# Patient Record
Sex: Male | Born: 1940 | Race: White | Hispanic: No | Marital: Married | Smoking: Former smoker
Health system: Southern US, Community
[De-identification: ages and names within clinical notes are randomized; demographics above are authoritative.]

## PROBLEM LIST (undated history)

## (undated) DIAGNOSIS — E785 Hyperlipidemia, unspecified: Secondary | ICD-10-CM

## (undated) DIAGNOSIS — L309 Dermatitis, unspecified: Secondary | ICD-10-CM

## (undated) DIAGNOSIS — M545 Low back pain, unspecified: Secondary | ICD-10-CM

## (undated) DIAGNOSIS — Z860100 Personal history of colon polyps, unspecified: Secondary | ICD-10-CM

## (undated) DIAGNOSIS — L409 Psoriasis, unspecified: Secondary | ICD-10-CM

## (undated) DIAGNOSIS — M1990 Unspecified osteoarthritis, unspecified site: Secondary | ICD-10-CM

## (undated) DIAGNOSIS — N400 Enlarged prostate without lower urinary tract symptoms: Secondary | ICD-10-CM

## (undated) DIAGNOSIS — G4730 Sleep apnea, unspecified: Secondary | ICD-10-CM

## (undated) DIAGNOSIS — R351 Nocturia: Secondary | ICD-10-CM

## (undated) DIAGNOSIS — F329 Major depressive disorder, single episode, unspecified: Secondary | ICD-10-CM

## (undated) DIAGNOSIS — I10 Essential (primary) hypertension: Secondary | ICD-10-CM

## (undated) DIAGNOSIS — R251 Tremor, unspecified: Secondary | ICD-10-CM

## (undated) DIAGNOSIS — F32A Depression, unspecified: Secondary | ICD-10-CM

## (undated) DIAGNOSIS — Z87442 Personal history of urinary calculi: Secondary | ICD-10-CM

## (undated) DIAGNOSIS — G629 Polyneuropathy, unspecified: Secondary | ICD-10-CM

## (undated) DIAGNOSIS — E119 Type 2 diabetes mellitus without complications: Secondary | ICD-10-CM

## (undated) DIAGNOSIS — R3915 Urgency of urination: Secondary | ICD-10-CM

## (undated) HISTORY — PX: OTHER SURGICAL HISTORY: SHX169

## (undated) HISTORY — PX: EYE SURGERY: SHX253

## (undated) HISTORY — DX: Polyneuropathy, unspecified: G62.9

## (undated) HISTORY — PX: CIRCUMCISION: SUR203

## (undated) HISTORY — PX: ANKLE FUSION: SHX881

## (undated) HISTORY — DX: Tremor, unspecified: R25.1

## (undated) HISTORY — PX: COLONOSCOPY: SHX174

## (undated) HISTORY — PX: ANKLE ARTHROSCOPY: SUR85

## (undated) HISTORY — DX: Type 2 diabetes mellitus without complications: E11.9

## (undated) HISTORY — DX: Depression, unspecified: F32.A

## (undated) HISTORY — DX: Essential (primary) hypertension: I10

## (undated) HISTORY — DX: Major depressive disorder, single episode, unspecified: F32.9

---

## 2015-02-06 ENCOUNTER — Encounter: Payer: Self-pay | Admitting: Neurology

## 2015-02-06 ENCOUNTER — Ambulatory Visit (INDEPENDENT_AMBULATORY_CARE_PROVIDER_SITE_OTHER): Payer: Medicare Other | Admitting: Neurology

## 2015-02-06 VITALS — BP 120/80 | HR 70 | Ht 73.0 in | Wt 264.0 lb

## 2015-02-06 DIAGNOSIS — E1342 Other specified diabetes mellitus with diabetic polyneuropathy: Secondary | ICD-10-CM | POA: Diagnosis not present

## 2015-02-06 DIAGNOSIS — G250 Essential tremor: Secondary | ICD-10-CM | POA: Diagnosis not present

## 2015-02-06 DIAGNOSIS — E1142 Type 2 diabetes mellitus with diabetic polyneuropathy: Secondary | ICD-10-CM

## 2015-02-06 DIAGNOSIS — G629 Polyneuropathy, unspecified: Secondary | ICD-10-CM

## 2015-02-06 NOTE — Progress Notes (Signed)
Subjective:   Logan Griffith was seen in consultation in the movement disorder clinic at the request of LONG,RANDY, MD.  The evaluation is for tremor.  The patient is seen in consultation at the request of Dr. Jannifer Franklin.  I have reviewed her notes and appreciate those.  This patient is accompanied in the office by his spouse and child who supplement the history.  The patient has had essential tremor for approximately 5 years according to records.   It has been worse over the last 2 years.  It is in both hands.  He notices it most in the R hand but he is R hand dominant.  He has trouble writing checks and eating sounds.  The patient is on primidone, 250 mg daily.  He has never been on a higher dosage than this but he indicates that it is not helpful.  The patient states he is on labetalol, 300 mg twice a day.  He was told that he could take a higher dosage of this to see if that helped his tremor, but he has not done that.  Pt states that the labetolol is for BP, however.   He has noticed no benefit with the primidone.  Unfortunately, these medications are not helping.  He does have a history of kidney stones, and therefore is not able to have Topamax.  There is no family hx of tremor.  He comes in today looking for an opinion regarding DBS therapy.   Affected by caffeine:  No. (1-2 cups coffee/day - 18 oz each; 1-2 diet soda's a day) Affected by alcohol:  No. Affected by stress:  No. Affected by fatigue:  No. Spills soup if on spoon:  Yes.   Spills glass of liquid if full:  Yes.   Affects ADL's (tying shoes, brushing teeth, etc):  Yes.   (has to shave with 2 hands with blade)  Current/Previously tried tremor medications: primidone; beta blocker for BP  Outside reports reviewed: historical medical records and office notes.  No Known Allergies  Outpatient Encounter Prescriptions as of 02/06/2015  Medication Sig  . amLODipine (NORVASC) 10 MG tablet Take 10 mg by mouth daily.  Marland Kitchen aspirin 81 MG tablet  Take 81 mg by mouth daily.  Marland Kitchen atorvastatin (LIPITOR) 40 MG tablet Take 40 mg by mouth daily.  . beta carotene w/minerals (OCUVITE) tablet Take 1 tablet by mouth daily.  . Cholecalciferol (VITAMIN D3) 3000 UNITS TABS Take by mouth.  Marland Kitchen CINNAMON PO Take by mouth.  . empagliflozin (JARDIANCE) 25 MG TABS tablet Take 25 mg by mouth daily.  . Flaxseed, Linseed, (FLAXSEED OIL) 1000 MG CAPS Take by mouth.  . gabapentin (NEURONTIN) 600 MG tablet Take 600 mg by mouth 2 (two) times daily.  Marland Kitchen GARLIC PO Take by mouth.  Marland Kitchen gemfibrozil (LOPID) 600 MG tablet Take 600 mg by mouth 2 (two) times daily before a meal.  . glimepiride (AMARYL) 4 MG tablet Take 4 mg by mouth daily with breakfast.  . HYDROcodone-acetaminophen (VICODIN) 5-500 MG per tablet Take 1 tablet by mouth every 6 (six) hours as needed for pain.  Marland Kitchen labetalol (NORMODYNE) 300 MG tablet Take 300 mg by mouth 2 (two) times daily.  Marland Kitchen losartan-hydrochlorothiazide (HYZAAR) 100-25 MG per tablet Take 1 tablet by mouth daily.  . Methylsulfonylmethane (MSM) 1000 MG CAPS Take by mouth.  . Multiple Vitamin (MULTIVITAMIN) tablet Take 1 tablet by mouth daily.  Marland Kitchen oxybutynin (DITROPAN) 5 MG tablet Take 5 mg by mouth 3 (three) times daily.  Marland Kitchen  Potassium 99 MG TABS Take by mouth.  . primidone (MYSOLINE) 250 MG tablet Take 250 mg by mouth at bedtime.  . Probiotic Product (PROBIOTIC PO) Take by mouth.  . sertraline (ZOLOFT) 100 MG tablet Take 100 mg by mouth daily.  . tamsulosin (FLOMAX) 0.4 MG CAPS capsule Take 0.4 mg by mouth.  . vitamin B-12 (CYANOCOBALAMIN) 1000 MCG tablet Take 1,000 mcg by mouth daily.   No facility-administered encounter medications on file as of 02/06/2015.    Past Medical History  Diagnosis Date  . Diabetes   . Hypertension   . Peripheral neuropathy   . Tremor   . Depression   . Prostate disorder   . Back pain     Past Surgical History  Procedure Laterality Date  . Thumb fusion    . Ankle arthroscopy Left   . Circumcision       Social History   Social History  . Marital Status: Married    Spouse Name: N/A  . Number of Children: N/A  . Years of Education: N/A   Occupational History  . Not on file.   Social History Main Topics  . Smoking status: Former Smoker    Quit date: 06/06/1982  . Smokeless tobacco: Not on file  . Alcohol Use: 0.0 oz/week    0 Standard drinks or equivalent per week     Comment: once a week  . Drug Use: No  . Sexual Activity: Not on file   Other Topics Concern  . Not on file   Social History Narrative  . No narrative on file    Family Status  Relation Status Death Age  . Mother Deceased     HTN  . Father Deceased     HTN  . Brother Alive     Review of Systems He admits to poor balance, and his son mentions this as well.  A complete 10 system ROS was obtained and was negative apart from what is mentioned.   Objective:   VITALS:   Filed Vitals:   02/06/15 1413  BP: 120/80  Pulse: 70  Height: 6\' 1"  (1.854 m)  Weight: 264 lb (119.75 kg)   Gen:  Appears stated age and in NAD. HEENT:  Normocephalic, atraumatic. The mucous membranes are moist. The superficial temporal arteries are without ropiness or tenderness. Cardiovascular: Regular rate and rhythm. Lungs: Clear to auscultation bilaterally. Neck: There are no carotid bruits noted bilaterally.  NEUROLOGICAL:  Orientation:  The patient is alert and oriented x 3.  Recent and remote memory are intact.  Attention span and concentration are normal.  Able to name objects and repeat without trouble.  Fund of knowledge is appropriate Cranial nerves: There is good facial symmetry. The pupils are equal round and reactive to light bilaterally. Fundoscopic exam reveals clear disc margins bilaterally. Extraocular muscles are intact and visual fields are full to confrontational testing. Speech is fluent and clear. Soft palate rises symmetrically and there is no tongue deviation. Hearing is intact to conversational  tone. Tone: Tone is good throughout. Sensation: Sensation is intact to light touch and pinprick throughout (facial, trunk, extremities). Vibration is decreased at the bilateral big toe. There is no extinction with double simultaneous stimulation. There is no sensory dermatomal level identified. Coordination:  The patient has no dysdiadichokinesia or dysmetria. Motor: Strength is 5/5 in the bilateral upper and lower extremities.  Shoulder shrug is equal bilaterally.  There is no pronator drift.  There are no fasciculations noted. DTR's: Deep tendon reflexes  are 1/4 at the bilateral biceps, triceps, brachioradialis, patella and achilles.  Plantar responses are downgoing bilaterally. Gait and Station: The patient is able to ambulate without difficulty. The patient is unable to ambulate in a tandem fashion.  He sways with eyes closed in the romberg position.    MOVEMENT EXAM: Tremor:  There is tremor in the UE, noted most significantly with action.  The right is worse than the left.  The patient has significant difficulty with Archimedes spirals bilaterally, right worse than left.  He spills water all over when asked to pour a full glass of water from one glass to another.  He has jaw tremor.       Assessment/Plan:   1.  Essential Tremor.  -This is evidenced by the symmetrical nature (although the right hand is worse) and longstanding hx of gradually getting worse.  He has tried and failed primidone and is on a high dose beta blocker for blood pressure.  He is not a candidate for Topamax because of a history of nephrolithiasis.  He is interested in DBS therapy.  I talked to the patient about the logistics associated with DBS therapy.  I talked to the patient about risks/benefits/side effects of DBS therapy.  We talked about risks which included but were not limited to infection, paralysis, intraoperative seizure, death, stroke, bleeding around the electrode.   I talked to patient about fiducial placement  1 week prior to DBS therapy.  I talked to the patient about what to expect in the operating room, including the fact that this is an awake surgery.  We talked about battery placement as well as which is done under general anesthesia, generally approximately one week following the initial surgery.  We also talked about the fact that the patient will need to be off of medications for surgery.  I talked to him about the fact that bilateral placement of electrodes for essential tremor can cause loss of balance and 30% of the patient's; therefore, in his case, since he already has loss of balance, I would recommend unilateral electrode placement of the left VIM.  The patient and family were given the opportunity to ask questions, which they did, and I answered them to the best of my ability today.  They were given handouts.  They were given the opportunity to talk to other patients or a [employer] rep, if they wish.  They will let me know if they want to seek out these sources.  For now, they ask if they can proceed to [facility] for the neuropsych testing.  I will refer him.  In the meantime, I asked him to start controlling his diabetes better as infection risk is higher with poorly controlled diabetes. 2.  Diabetic peripheral neuropathy  -I do think that this the main reason for loss of balance.  We discussed control of diabetes.  Discussed safety. 3.  Much greater than 50% of this visit was spent in counseling with the patient and the family.  Total face to face time:  65 min

## 2015-02-06 NOTE — Patient Instructions (Signed)
1. You have been referred to Dr Nathanial Rancher for Neuro Psych testing. They will call you directly to schedule an appointment. Please call [phone] if you do not hear from them.    Deep Brain Stimulation  Is it the right choice for me?   What is Deep Brain Stimulation (DBS) Surgery?  DBS is a surgical procedure used to treat symptoms of Essential Tremor (ET). It involves the implantation of an electrode into the brain either on one side or both sides. The area of the brain that is typically targeted in ET is the ventral intermediate (VIM) nucleus of the thalamus.   How does DBS work?  The tremor in ET is due to an overactive and oscillating circuit in the brain. With DBS, electricity is sent down the electrodes into this area of the brain to disrupt this abnormal circuit, thereby blocking the tremor. It is important to remember, however, that DBS does not "cure" the disease, but rather is a method of treating the symptoms.   What is involved in the procedure?  The surgical procedure involves the implantation of either one (to treat tremor on one side of the body) or two (to treat tremor on both sides of the body) electrodes. The electrodes are connected to a wire, which is then connected to a generator (either one or two) in the chest. The generator (and wires) are placed under the skin similar to a cardiac pacemaker, thus the device itself is not visible. There will,  however, be a visible lump under the skin where the pacemaker is placed. There will also be small bumps on the scalp where the electrode is secured to the skull.   What symptoms can I expect DBS to treat?  In ET the main symptom is the tremor which the DBS system will treat.   What is the downside to having DBS surgery?  There are 2 major factors that need to be considered prior to having surgery, 1) Risks involved, and 2) the "inconvenience factor".   What are the risks of surgery?  Because the surgery involves introducing  a foreign object into the brain, there are inherent risks that are present. First, there is a very small risk, approximately 1-2% chance, of having bleeding into the brain causing symptoms similar to that of a stroke. Secondly, there is a 5-10% chance of having an infection related to the procedure. If the device gets infected, then the treatment usually requires that the infected hardware be removed temporarily while antibiotics are given. After the infection is resolved, then the hardware needs to be re-implanted. This would not leave the patient with permanent problems, but it is easy to understand how disappointed someone might be if they have to go through the surgery again. Symptoms of infection include redness, swelling, or pain around the device. Another risk of brain surgery includes possible seizure. Seizures are abnormal electrical discharges of brain cells. If a seizure occurs, it is almost always at the time of operation. It may require temporarily being treated with seizure medications, but this is typically only short term. Very rarely (much less than 1%) does the infection become more serious and involve the brain.  In some patients with ET we will place electrodes on both sides of the brain to treat tremor on both sides of the body. In these patients there is a 30% chance of causing speech or balance problems when both sides are fully activated. To get rid of this side effect, the intensity of  stimulation usually needs to be lowered in one of the electrodes. If this is done, the side effect may well resolve, but some of the tremor may return.  If the electrodes are perfectly placed and programmed, we can expect an impressive improvement in your tremor. If they are close, but not quite perfectly placed, then there may be some residual tremor  which does not resolve with treatment. This is the main reason we go to such lengths the day of the electrode implantation to be sure we have the electrode  optimally placed.   What is the "inconvenience factor"?  Unfortunately, undergoing DBS surgery is a process involving multiple steps. Even prior to surgery, you will have several visits (which are explained in detail on the subsequent page). The surgery itself takes place in three separate parts. About a week prior to insertion of the electrodes, you will be seen in an ambulatory surgery center to put in markers into the skull, called fiducials. This allows [place] to plan the surgery and to better localize the area in which we will operate. One week later, stage 1 of the procedure will be done in which the electrodes are implanted. Approximately one week later, stage 2 of the surgery will be done in which the generator (battery) is inserted. Weeks later, programming of the device will take place. Programming is fine-tuned over a series of clinic visits, initially 1-2x per week, and then eventually less frequent. Overall, you can expect at least 8-10 visits prior to seeing benefit from DBS surgery.   Is DBS the right choice for you?  As you can now see, there are many things that carefully need to be considered when making this decision. The ultimate decision is yours to make. ET is not a life threatening disease. It can be very disabling and significantly interfere with one's quality of life. It is our job to provide you with all the pros and cons that can help you make the right choice for you. The main question is whether the tremor is so bothersome to you that it is worth it to take a small but significant risk to treat it. We hope this information will guide you in your decision.   Pre-Operative Visits:  1) During this visit your exam will be videotaped with your consent. We will discuss the details of the surgery and answer any more questions you might have.  2) Neuropsychological Testing. This is standard testing in all potential candidates to help determine those patients that may be at high risk for  developing worsening cognition from the procedure. This is a long clinic visit (multiple hours) and can be quite exhausting.  3) Pre-Operative MRI. If you are deemed to be a good surgical candidate based on the above 2 visits, you will need to have MRI imaging done to be used in the surgical planning process. You will need someone to accompany you to this visit that can drive, as sometimes it is necessary to sedate you for the MRI in order to get adequate pictures.  4) You will also have an appointment with the neurosurgeon who works with Dr. Carles Collet. His name is Dr. Vertell Limber.  What to expect regarding the surgery itself:  1. The first step involves placement of the fiducial pins. You are given 5 local injections of anesthetic (numbing medication). Next, 5 pins are screwed into the skull. Following the placement of the pins, you will be transferred down to have a head CT scan. This is used  in planning for the surgery. This step is generally done one week prior to scheduled surgery.  2. One week later, you will arrive to the pre-op area OFF of any tremor medications that may have been prescribed. You will meet nursing and anesthesia staff. A catheter may be placed into the bladder.  3. You will have the sense of "hurry up and wait" multiple times throughout the day, but it is extremely important to remain as patient as possible. It is during these times that we are busy "behind the scenes" doing the surgical planning with all of the imaging scans that you've had done.  4. You are then brought into the OR suite and placed in a "beach chair"-like position. You will not be under general anesthesia. We need you to be awake during certain parts to allow [place] to do important testing. Because you are awake and having brain surgery, it is not surprising that this is very anxiety provoking and scary! We understand this and will help you through it to the best of our abilities. The actual surgical procedure is not painful. It is  done with local anesthetic agents. However, the procedure can take up to 6-8 hours, and it is expected that you'll become uncomfortable. We try to minimize any sedating medications, but will give you pain medicines if needed.  5. You will have a bad haircut, but it will grow back!  6. Following the surgery, you will stay overnight in the hospital for observation.  7. The following day, you will have a very special kind brain MRI scan to allow [place] to evaluate the placement of the electrodes and also to make sure there were no bleeding complications. Provided there are no complications, you will be discharged home the day after surgery.   * It is extremely important to remember that after having DBS surgery, you will no longer be able to have a typical MRI scan without informing your provider of your surgery. This can lead to heating of the electrode wires causing serious burns to the brain and even death.   8. About 1 weeks later you will return for an outpatient surgery that lasts 1-2 hours during which the generator(s) will be placed. You will go home on the same day as the surgery. You will find that you are more uncomfortable after this surgery then your first surgery. You will be given medications to help with this. The pain from this surgery usually resolves in 2-3 days.

## 2015-02-19 ENCOUNTER — Telehealth: Payer: Self-pay | Admitting: *Deleted

## 2015-02-19 DIAGNOSIS — G250 Essential tremor: Secondary | ICD-10-CM

## 2015-02-19 NOTE — Telephone Encounter (Signed)
Patient called asking about his referral to [facility].  Please call him back and let him know.

## 2015-02-19 NOTE — Addendum Note (Signed)
Addended byAnnamaria Helling on: 02/19/2015 01:01 PM   Modules accepted: Orders

## 2015-02-19 NOTE — Telephone Encounter (Signed)
LMOM making patient aware records and referral refaxed to Dr Conley Canal at [phone] with confirmation received. They will call him to schedule. Given number to their office and to call me back with any questions.

## 2015-02-23 ENCOUNTER — Telehealth: Payer: Self-pay | Admitting: Neurology

## 2015-02-23 NOTE — Telephone Encounter (Signed)
Confirmed with [facility] that they will contact patient with appt this week. Patient's wife made aware.

## 2015-02-23 NOTE — Telephone Encounter (Signed)
[phone]  Pt wants to know the status of the referral to [facility]

## 2015-04-02 ENCOUNTER — Telehealth: Payer: Self-pay | Admitting: Neurology

## 2015-04-02 NOTE — Telephone Encounter (Signed)
Called patient and notified him of results. Appt scheduled for 04/07/15 to discuss DBS.

## 2015-04-02 NOTE — Telephone Encounter (Signed)
Let pt know that I got neuropsych testing back and he did well.  IF he is still interested in DBS therapy, have him make appt and we can discuss next steps if he would like.  There are appts open next week

## 2015-04-07 ENCOUNTER — Ambulatory Visit (INDEPENDENT_AMBULATORY_CARE_PROVIDER_SITE_OTHER): Payer: Medicare Other | Admitting: Neurology

## 2015-04-07 ENCOUNTER — Telehealth: Payer: Self-pay | Admitting: Neurology

## 2015-04-07 ENCOUNTER — Encounter: Payer: Self-pay | Admitting: Neurology

## 2015-04-07 VITALS — BP 136/84 | HR 78 | Ht 73.0 in | Wt 268.0 lb

## 2015-04-07 DIAGNOSIS — G250 Essential tremor: Secondary | ICD-10-CM | POA: Diagnosis not present

## 2015-04-07 DIAGNOSIS — Z01818 Encounter for other preprocedural examination: Secondary | ICD-10-CM | POA: Diagnosis not present

## 2015-04-07 NOTE — Telephone Encounter (Signed)
Referral faxed to [facility] at [phone] with confirmation received. They will contact the patient to schedule.  

## 2015-04-07 NOTE — Patient Instructions (Addendum)
1. We are referring you to Dr Vertell Limber at [facility]. They will call you directly to set up an appointment date and time. If you do not hear from them they can be contacted directly at [phone]. 2. We have scheduled you at [facility] for your MRI on 04/16/15 at 4:00 pm. Please arrive 15 minutes prior and go to 1st floor radiology. If you need to reschedule for any reason please call [phone].

## 2015-04-07 NOTE — Progress Notes (Signed)
Subjective:   Logan Griffith was seen in consultation in the movement disorder clinic at the request of LONG,RANDY, MD.  The evaluation is for tremor.  The patient is seen in consultation at the request of Dr. Jannifer Franklin.  I have reviewed her notes and appreciate those.  This patient is accompanied in the office by his spouse and child who supplement the history.  The patient has had essential tremor for approximately 5 years according to records.   It has been worse over the last 2 years.  It is in both hands.  He notices it most in the R hand but he is R hand dominant.  He has trouble writing checks and eating sounds.  The patient is on primidone, 250 mg daily.  He has never been on a higher dosage than this but he indicates that it is not helpful.  The patient states he is on labetalol, 300 mg twice a day.  He was told that he could take a higher dosage of this to see if that helped his tremor, but he has not done that.  Pt states that the labetolol is for BP, however.   He has noticed no benefit with the primidone.  Unfortunately, these medications are not helping.  He does have a history of kidney stones, and therefore is not able to have Topamax.  There is no family hx of tremor.  He comes in today looking for an opinion regarding DBS therapy.   Affected by caffeine:  No. (1-2 cups coffee/day - 18 oz each; 1-2 diet soda's a day) Affected by alcohol:  No. Affected by stress:  No. Affected by fatigue:  No. Spills soup if on spoon:  Yes.   Spills glass of liquid if full:  Yes.   Affects ADL's (tying shoes, brushing teeth, etc):  Yes.   (has to shave with 2 hands with blade)  Current/Previously tried tremor medications: primidone; beta blocker for BP  Outside reports reviewed: historical medical records and office notes.  04/07/15 update:  The patient returns today, accompanied by his wife and son who supplement the history.  The patient had neuropsych testing done by Dr. Richrd Sox on 03/18/2015.  She  felt that he would be a good DBS candidate.  No evidence of dementia.  The patient remains on primidone, 250 mg twice a day for his tremor.  He is also on labetalol 300 mg twice a day but that is primarily for his blood pressure.  He has a history of nephrolithiasis and cannot take Topamax.  He remains interested in DBS therapy.  No Known Allergies  Outpatient Encounter Prescriptions as of 04/07/2015  Medication Sig  . amLODipine (NORVASC) 10 MG tablet Take 10 mg by mouth daily.  Marland Kitchen aspirin 81 MG tablet Take 81 mg by mouth daily.  Marland Kitchen atorvastatin (LIPITOR) 40 MG tablet Take 40 mg by mouth daily.  . beta carotene w/minerals (OCUVITE) tablet Take 1 tablet by mouth daily.  . Cholecalciferol (VITAMIN D3) 3000 UNITS TABS Take by mouth.  Marland Kitchen CINNAMON PO Take by mouth.  . empagliflozin (JARDIANCE) 25 MG TABS tablet Take 25 mg by mouth daily.  . Flaxseed, Linseed, (FLAXSEED OIL) 1000 MG CAPS Take by mouth.  . gabapentin (NEURONTIN) 600 MG tablet Take 600 mg by mouth 2 (two) times daily.  Marland Kitchen GARLIC PO Take by mouth.  Marland Kitchen gemfibrozil (LOPID) 600 MG tablet Take 600 mg by mouth 2 (two) times daily before a meal.  . glimepiride (AMARYL) 4 MG tablet  Take 4 mg by mouth daily with breakfast.  . HYDROcodone-acetaminophen (VICODIN) 5-500 MG per tablet Take 1 tablet by mouth every 6 (six) hours as needed for pain.  . Insulin Glargine (TOUJEO SOLOSTAR [place]) Inject into the skin.  Marland Kitchen labetalol (NORMODYNE) 300 MG tablet Take 300 mg by mouth 2 (two) times daily.  Marland Kitchen linagliptin (TRADJENTA) 5 MG TABS tablet Take 5 mg by mouth daily.  Marland Kitchen losartan-hydrochlorothiazide (HYZAAR) 100-25 MG per tablet Take 1 tablet by mouth daily.  . Methylsulfonylmethane (MSM) 1000 MG CAPS Take by mouth.  . Multiple Vitamin (MULTIVITAMIN) tablet Take 1 tablet by mouth daily.  Marland Kitchen oxybutynin (DITROPAN) 5 MG tablet Take 5 mg by mouth 3 (three) times daily.  . Potassium 99 MG TABS Take by mouth.  . primidone (MYSOLINE) 250 MG tablet Take 250 mg by mouth  at bedtime.  . Probiotic Product (PROBIOTIC PO) Take by mouth.  . sertraline (ZOLOFT) 100 MG tablet Take 100 mg by mouth daily.  . tamsulosin (FLOMAX) 0.4 MG CAPS capsule Take 0.4 mg by mouth.  . vitamin B-12 (CYANOCOBALAMIN) 1000 MCG tablet Take 1,000 mcg by mouth daily.   No facility-administered encounter medications on file as of 04/07/2015.    Past Medical History  Diagnosis Date  . Diabetes   . Hypertension   . Peripheral neuropathy   . Tremor   . Depression   . Prostate disorder   . Back pain   . Nephrolithiasis     Past Surgical History  Procedure Laterality Date  . Thumb fusion    . Ankle arthroscopy Left   . Circumcision      Social History   Social History  . Marital Status: Married    Spouse Name: N/A  . Number of Children: N/A  . Years of Education: N/A   Occupational History  . Not on file.   Social History Main Topics  . Smoking status: Former Smoker    Quit date: 06/06/1982  . Smokeless tobacco: Not on file  . Alcohol Use: 0.0 oz/week    0 Standard drinks or equivalent per week     Comment: once a week  . Drug Use: No  . Sexual Activity: Not on file   Other Topics Concern  . Not on file   Social History Narrative  . No narrative on file    Family Status  Relation Status Death Age  . Mother Deceased     HTN  . Father Deceased     HTN  . Brother Alive     Review of Systems He admits to poor balance, and his son mentions this as well.  A complete 10 system ROS was obtained and was negative apart from what is mentioned.   Objective:   VITALS:   Filed Vitals:   04/07/15 0840  BP: 136/84  Pulse: 78  Height: 6\' 1"  (1.854 m)  Weight: 268 lb (121.564 kg)   Gen:  Appears stated age and in NAD. HEENT:  Normocephalic, atraumatic. The mucous membranes are moist. The superficial temporal arteries are without ropiness or tenderness. Cardiovascular: Regular rate and rhythm. Lungs: Clear to auscultation bilaterally. Neck: There are no  carotid bruits noted bilaterally.  NEUROLOGICAL:  Orientation:  The patient is alert and oriented x 3.  Recent and remote memory are intact.  Attention span and concentration are normal.  Able to name objects and repeat without trouble.  Fund of knowledge is appropriate Cranial nerves: There is good facial symmetry. The pupils are equal round  and reactive to light bilaterally. Fundoscopic exam reveals clear disc margins bilaterally. Extraocular muscles are intact and visual fields are full to confrontational testing. Speech is fluent and clear. Soft palate rises symmetrically and there is no tongue deviation. Hearing is intact to conversational tone. Tone: Tone is good throughout. Sensation: Sensation is intact to light touch throughout Coordination:  The patient has no dysdiadichokinesia or dysmetria. Motor: Strength is 5/5 in the bilateral upper and lower extremities.  Shoulder shrug is equal bilaterally.  There is no pronator drift.  There are no fasciculations noted. Gait and Station: The patient is able to ambulate without difficulty. The patient is unable to ambulate in a tandem fashion.  He sways with eyes closed in the romberg position.    MOVEMENT EXAM: Tremor:  There is tremor in the UE, noted most significantly with action.  The right is worse than the left.       Assessment/Plan:   1.  Essential Tremor.  -This is evidenced by the symmetrical nature (although the right hand is worse) and longstanding hx of gradually getting worse.  He has tried and failed primidone and is on a high dose beta blocker for blood pressure.  He is not a candidate for Topamax because of a history of nephrolithiasis.  Neuropsych testing did not reveal any significant cognitive dysfunction.  He is interested in DBS therapy.  I talked to the patient about the logistics associated with DBS therapy.  I talked to the patient about risks/benefits/side effects of DBS therapy.  We talked again about risks which  included but were not limited to infection, paralysis, intraoperative seizure, death, stroke, bleeding around the electrode.   I talked to patient about fiducial placement 1 week prior to DBS therapy.  I talked to the patient about what to expect in the operating room, including the fact that this is an awake surgery.  We talked about battery placement as well as which is done under general anesthesia, generally approximately one week following the initial surgery.  We also talked about the fact that the patient will need to be off of medications for surgery.  I talked to him about the fact that bilateral placement of electrodes for essential tremor can cause loss of balance and 30% of the patient's; therefore, in his case, since he already has loss of balance, I would recommend unilateral electrode placement of the left VIM.   He was thinking that he wanted bilateral but after our talk today, he is inclined to agree about unilateral placement but wants to think about it and will definitively let me know. He is R hand dominant so placement would be in the L VIM.  The patient and family were given the opportunity to ask questions, which they did, and I answered them to the best of my ability today.  I will refer to Dr. Vertell Limber for consult and order pre-op MRI brain  -Needs labs before surgery especially to monitor A1C.  Stated that he needed new PCP and encouraged him to find one ASAP 2.  Diabetic peripheral neuropathy  -I do think that this the main reason for loss of balance.  We discussed control of diabetes.  Discussed safety. 3.  Much greater than 50% of this visit was spent in counseling with the patient and the family.  Total face to face time:  40 min

## 2015-04-16 ENCOUNTER — Ambulatory Visit (HOSPITAL_COMMUNITY)
Admission: RE | Admit: 2015-04-16 | Discharge: 2015-04-16 | Disposition: A | Discharge status: Home / Self Care | Payer: Medicare Other | Source: Ambulatory Visit | Attending: Neurology | Admitting: Neurology

## 2015-04-16 DIAGNOSIS — Z01818 Encounter for other preprocedural examination: Secondary | ICD-10-CM | POA: Insufficient documentation

## 2015-04-16 DIAGNOSIS — G250 Essential tremor: Secondary | ICD-10-CM

## 2015-04-16 DIAGNOSIS — D320 Benign neoplasm of cerebral meninges: Secondary | ICD-10-CM | POA: Insufficient documentation

## 2015-04-16 MED ORDER — GADOBENATE DIMEGLUMINE 529 MG/ML IV SOLN
10.0000 mL | Freq: Once | INTRAVENOUS | Status: AC | PRN
  Administered 2015-04-16: 10 mL via INTRAVENOUS

## 2015-04-17 ENCOUNTER — Telehealth: Payer: Self-pay | Admitting: Neurology

## 2015-04-17 LAB — POCT I-STAT CREATININE: CREATININE: 1.8 mg/dL — AB (ref 0.61–1.24)

## 2015-04-17 NOTE — Telephone Encounter (Signed)
Labs requested from PCP. Will make patient aware when I call him with MR results.

## 2015-04-17 NOTE — Telephone Encounter (Signed)
-----   Message from Person, DO sent at 04/17/2015  9:27 AM EST ----- You can let pt know that when they checked kidney function for MRI, it was up a bit.  It may be his baseline but I don't know.  Get labs from PCP.  Tell pt f/u with PCP.

## 2015-05-27 ENCOUNTER — Other Ambulatory Visit: Payer: Self-pay | Admitting: Neurosurgery

## 2015-05-27 ENCOUNTER — Other Ambulatory Visit (HOSPITAL_COMMUNITY): Payer: Self-pay | Admitting: Neurosurgery

## 2015-05-27 DIAGNOSIS — G250 Essential tremor: Secondary | ICD-10-CM

## 2015-06-11 ENCOUNTER — Telehealth: Payer: Self-pay | Admitting: Neurology

## 2015-06-11 NOTE — Telephone Encounter (Signed)
Would like pt to make appt with me before surgery to discuss logistics, do pre-op video, answer questions, etc

## 2015-06-11 NOTE — Telephone Encounter (Signed)
Called and spoke with patient's wife - she will have patient call me back to schedule.

## 2015-06-12 NOTE — Telephone Encounter (Signed)
Appt made

## 2015-06-18 ENCOUNTER — Encounter: Payer: Self-pay | Admitting: Neurology

## 2015-06-18 ENCOUNTER — Other Ambulatory Visit (INDEPENDENT_AMBULATORY_CARE_PROVIDER_SITE_OTHER): Payer: Medicare Other

## 2015-06-18 ENCOUNTER — Ambulatory Visit (INDEPENDENT_AMBULATORY_CARE_PROVIDER_SITE_OTHER): Payer: Medicare Other | Admitting: Neurology

## 2015-06-18 VITALS — BP 158/70 | HR 62 | Ht 73.0 in | Wt 268.0 lb

## 2015-06-18 DIAGNOSIS — N289 Disorder of kidney and ureter, unspecified: Secondary | ICD-10-CM

## 2015-06-18 DIAGNOSIS — G250 Essential tremor: Secondary | ICD-10-CM

## 2015-06-18 DIAGNOSIS — Z5181 Encounter for therapeutic drug level monitoring: Secondary | ICD-10-CM

## 2015-06-18 DIAGNOSIS — E1129 Type 2 diabetes mellitus with other diabetic kidney complication: Secondary | ICD-10-CM

## 2015-06-18 LAB — COMPREHENSIVE METABOLIC PANEL
ALT: 19 U/L (ref 0–53)
AST: 11 U/L (ref 0–37)
Albumin: 4.4 g/dL (ref 3.5–5.2)
Alkaline Phosphatase: 72 U/L (ref 39–117)
BILIRUBIN TOTAL: 0.4 mg/dL (ref 0.2–1.2)
BUN: 30 mg/dL — ABNORMAL HIGH (ref 6–23)
CHLORIDE: 103 meq/L (ref 96–112)
CO2: 28 meq/L (ref 19–32)
CREATININE: 1.47 mg/dL (ref 0.40–1.50)
Calcium: 9.6 mg/dL (ref 8.4–10.5)
GFR: 49.61 mL/min — AB (ref >60.00)
GLUCOSE: 135 mg/dL — AB (ref 70–99)
Potassium: 4.1 mEq/L (ref 3.5–5.1)
Sodium: 138 mEq/L (ref 135–145)
Total Protein: 6.7 g/dL (ref 6.0–8.3)

## 2015-06-18 LAB — HEMOGLOBIN A1C: Hgb A1c MFr Bld: 7.3 % — ABNORMAL HIGH (ref 4.6–6.5)

## 2015-06-18 LAB — TSH: TSH: 3.59 u[IU]/mL (ref 0.35–4.50)

## 2015-06-18 NOTE — Patient Instructions (Addendum)
1. Your provider has requested that you have labwork completed today. Please go to [facility] ([id number]) on the second floor of this building before leaving the office today. You do not need to check in. If you are not called within 15 minutes please check with the front desk.  2. Decrease Primidone as follows:  February 9th decrease to 200 mg at night February 12th decrease to 150 mg at night February 15th decrease to 100 mg at night February 18th decrease to 50 mg at night Last dose of Primidone the night of February 21. Surgery- 07/31/2015

## 2015-06-18 NOTE — Progress Notes (Signed)
Subjective:   Logan Griffith was seen in consultation in the movement disorder clinic at the request of LONG,RANDY, MD.  The evaluation is for tremor.  The patient is seen in consultation at the request of Dr. Jannifer Franklin.  I have reviewed her notes and appreciate those.  This patient is accompanied in the office by his spouse and child who supplement the history.  The patient has had essential tremor for approximately 5 years according to records.   It has been worse over the last 2 years.  It is in both hands.  He notices it most in the R hand but he is R hand dominant.  He has trouble writing checks and eating sounds.  The patient is on primidone, 250 mg daily.  He has never been on a higher dosage than this but he indicates that it is not helpful.  The patient states he is on labetalol, 300 mg twice a day.  He was told that he could take a higher dosage of this to see if that helped his tremor, but he has not done that.  Pt states that the labetolol is for BP, however.   He has noticed no benefit with the primidone.  Unfortunately, these medications are not helping.  He does have a history of kidney stones, and therefore is not able to have Topamax.  There is no family hx of tremor.  He comes in today looking for an opinion regarding DBS therapy.   Affected by caffeine:  No. (1-2 cups coffee/day - 18 oz each; 1-2 diet soda's a day) Affected by alcohol:  No. Affected by stress:  No. Affected by fatigue:  No. Spills soup if on spoon:  Yes.   Spills glass of liquid if full:  Yes.   Affects ADL's (tying shoes, brushing teeth, etc):  Yes.   (has to shave with 2 hands with blade)  Current/Previously tried tremor medications: primidone; beta blocker for BP  Outside reports reviewed: historical medical records and office notes.  04/07/15 update:  The patient returns today, accompanied by his wife and son who supplement the history.  The patient had neuropsych testing done by Dr. Richrd Sox on 03/18/2015.  She  felt that he would be a good DBS candidate.  No evidence of dementia.  The patient remains on primidone, 250 mg twice a day for his tremor.  He is also on labetalol 300 mg twice a day but that is primarily for his blood pressure.  He has a history of nephrolithiasis and cannot take Topamax.  He remains interested in DBS therapy.  06/18/15 update: The patient follows up with me today, and final preparation for his DBS surgery.  It is scheduled for 07/30/2014.  He remains on primidone, 250 mg but is down to once a day.  He has seen Dr. Vertell Limber and reviewed logistics of surgery with him.  He expresses desire to proceed with DBS therapy.  No Known Allergies  Outpatient Encounter Prescriptions as of 06/18/2015  Medication Sig  . amLODipine (NORVASC) 10 MG tablet Take 10 mg by mouth daily.  Marland Kitchen aspirin 81 MG tablet Take 81 mg by mouth daily.  Marland Kitchen atorvastatin (LIPITOR) 40 MG tablet Take 40 mg by mouth daily.  . beta carotene w/minerals (OCUVITE) tablet Take 1 tablet by mouth daily.  . Cholecalciferol (VITAMIN D3) 3000 UNITS TABS Take by mouth.  Marland Kitchen CINNAMON PO Take by mouth.  . empagliflozin (JARDIANCE) 25 MG TABS tablet Take 25 mg by mouth daily.  Marland Kitchen  Flaxseed, Linseed, (FLAXSEED OIL) 1000 MG CAPS Take by mouth.  . gabapentin (NEURONTIN) 600 MG tablet Take 600 mg by mouth 2 (two) times daily.  Marland Kitchen GARLIC PO Take by mouth.  Marland Kitchen gemfibrozil (LOPID) 600 MG tablet Take 600 mg by mouth 2 (two) times daily before a meal.  . glimepiride (AMARYL) 4 MG tablet Take 4 mg by mouth daily with breakfast.  . HYDROcodone-acetaminophen (VICODIN) 5-500 MG per tablet Take 1 tablet by mouth every 6 (six) hours as needed for pain.  . Insulin Glargine (TOUJEO SOLOSTAR ) Inject into the skin.  Marland Kitchen labetalol (NORMODYNE) 300 MG tablet Take 300 mg by mouth 2 (two) times daily.  Marland Kitchen linagliptin (TRADJENTA) 5 MG TABS tablet Take 5 mg by mouth daily.  Marland Kitchen losartan-hydrochlorothiazide (HYZAAR) 100-25 MG per tablet Take 1 tablet by mouth daily.    . Methylsulfonylmethane (MSM) 1000 MG CAPS Take by mouth.  . Multiple Vitamin (MULTIVITAMIN) tablet Take 1 tablet by mouth daily.  Marland Kitchen oxybutynin (DITROPAN) 5 MG tablet Take 5 mg by mouth 3 (three) times daily.  . Potassium 99 MG TABS Take by mouth.  . primidone (MYSOLINE) 250 MG tablet Take 250 mg by mouth at bedtime.  . Probiotic Product (PROBIOTIC PO) Take by mouth.  . sertraline (ZOLOFT) 100 MG tablet Take 100 mg by mouth daily.  . tamsulosin (FLOMAX) 0.4 MG CAPS capsule Take 0.4 mg by mouth.  . vitamin B-12 (CYANOCOBALAMIN) 1000 MCG tablet Take 1,000 mcg by mouth daily.   No facility-administered encounter medications on file as of 06/18/2015.    Past Medical History  Diagnosis Date  . Diabetes ([place])   . Hypertension   . Peripheral neuropathy ([place])   . Tremor   . Depression   . Prostate disorder   . Back pain   . Nephrolithiasis     Past Surgical History  Procedure Laterality Date  . Thumb fusion    . Ankle arthroscopy Left   . Circumcision      Social History   Social History  . Marital Status: Married    Spouse Name: N/A  . Number of Children: N/A  . Years of Education: N/A   Occupational History  . Not on file.   Social History Main Topics  . Smoking status: Former Smoker    Quit date: 06/06/1982  . Smokeless tobacco: Not on file  . Alcohol Use: 0.0 oz/week    0 Standard drinks or equivalent per week     Comment: once a week  . Drug Use: No  . Sexual Activity: Not on file   Other Topics Concern  . Not on file   Social History Narrative    Family Status  Relation Status Death Age  . Mother Deceased     HTN  . Father Deceased     HTN  . Brother Alive     Review of Systems He admits to poor balance, and his son mentions this as well.  A complete 10 system ROS was obtained and was negative apart from what is mentioned.   Objective:   VITALS:   Filed Vitals:   06/18/15 1420  BP: 158/70  Pulse: 62  Height: 6\' 1"  (1.854 m)  Weight: 268 lb  (121.564 kg)   Gen:  Appears stated age and in NAD. HEENT:  Normocephalic, atraumatic. The mucous membranes are moist. The superficial temporal arteries are without ropiness or tenderness. Cardiovascular: Regular rate and rhythm. Lungs: Clear to auscultation bilaterally. Neck: There are no carotid bruits  noted bilaterally.  NEUROLOGICAL:  Orientation:  The patient is alert and oriented x 3.  Recent and remote memory are intact.  Attention span and concentration are normal.  Able to name objects and repeat without trouble.  Fund of knowledge is appropriate Cranial nerves: There is good facial symmetry. The pupils are equal round and reactive to light bilaterally. Fundoscopic exam reveals clear disc margins bilaterally. Extraocular muscles are intact and visual fields are full to confrontational testing. Speech is fluent and clear. Soft palate rises symmetrically and there is no tongue deviation. Hearing is intact to conversational tone. Tone: Tone is good throughout. Sensation: Sensation is intact to light touch throughout Coordination:  The patient has no dysdiadichokinesia or dysmetria. Motor: Strength is 5/5 in the bilateral upper and lower extremities.  Shoulder shrug is equal bilaterally.  There is no pronator drift.  There are no fasciculations noted. Gait and Station: The patient is able to ambulate without difficulty. The patient is unable to ambulate in a tandem fashion.  He sways with eyes closed in the romberg position.    MOVEMENT EXAM: Tremor:  There is tremor in the UE, noted most significantly with action.  The right is worse than the left.  There is a mild chin tremor.    I try to get lab work from his primary care physician in the last lab work that was available was from 11/17/2014.  At that point his creatinine was 1.93.  His chemistry was otherwise normal besides a glucose that today of 192.  His CBC was normal.     Assessment/Plan:   1.  Essential Tremor.  -This is  evidenced by the symmetrical nature (although the right hand is worse) and longstanding hx of gradually getting worse.  He has tried and failed primidone and is on a high dose beta blocker for blood pressure.  He is not a candidate for Topamax because of a history of nephrolithiasis.  Neuropsych testing did not reveal any significant cognitive dysfunction.  He still wishes to proceed with DBS therapy.  Asked multiple questions and answered them to the best of my ability.  Talked extensively about risks and benefits.  Wants unilateral so plan on L VIM.  Given this, likely will not help chin tremor.    -With the patient's permission, we did a preoperative video today.  -The patient was given a weaning schedule for how to wean primidone prior to surgery.  I did not wean his labetalol even though it can affect tremor, primarily because they are using that for blood pressure control.  -He will need lab work, including an updated chemistry, TSH and hemoglobin A1c. 2.  Diabetic peripheral neuropathy  -I do think that this the main reason for loss of balance.  We discussed control of diabetes.  His family is not convinced that he has been following diet.  Talked about increased risk of infection post op in DM, esp if uncontrolled.   Labs today.  Discussed safety. 3.  Much greater than 50% of this visit was spent in counseling with the patient and the family.  Total face to face time:  45 min

## 2015-06-19 ENCOUNTER — Telehealth: Payer: Self-pay | Admitting: Neurology

## 2015-06-19 NOTE — Telephone Encounter (Signed)
Patient made aware.

## 2015-06-19 NOTE — Telephone Encounter (Signed)
-----   Message from Napoleon, DO sent at 06/19/2015  8:05 AM EST ----- You can let Mr. Lanton know that his A1C improved to 7.3 (he needs to keep control of his diet esp with surgery upcoming)!  His renal function has also improved which is great!

## 2015-07-10 NOTE — H&P (Signed)
Patient ID:   [id number] Patient: Logan Griffith  Date of Birth: 12-Jun-1940 Visit Type: Office Visit   Date: 05/18/2015 02:30 PM Provider: Marchia Meiers. Vertell Limber MD   This 75 year old male presents for Tremor.  History of Present Illness: 1.  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  Patient recalls five years symptoms right greater than left hand tremor.  History: IDDM, HTN, essential tremor Surgical history: Left ankle 3, left hand, circumcision 2014  MRI on [place]  The patient complains of right greater than left hand tremor.  He says it is very difficult for him to do his activities of daily living and things such as eating are becoming increasingly difficult for him.  He says he cannot poor water and when he attempted to do so he poured it all over himself and Dr. Carles Collet.  The patient has had a fusion of his left ankle.  He denies problems with rigidity.  He was not felt to have any signs of parkinsonism.  Dr. Carles Collet has recommended left VIM thalamus DBS for predominantly right-sided tremor.  She has recommended a unilateral surgery.        PAST MEDICAL/SURGICAL HISTORY   (Detailed)  Disease/disorder Onset Date Management Date Comments    Ankle surgery (3)      Hand surgery      Circumsion 2014   Arthritis      Depression      Diabetes mellitus      High cholesterol      Hypertension         PAST MEDICAL HISTORY, SURGICAL HISTORY, FAMILY HISTORY, SOCIAL HISTORY AND REVIEW OF SYSTEMS I have reviewed the patient's past medical, surgical, family and social history as well as the comprehensive review of systems as included on the [facility] history form dated 05/18/2015, which I have signed.  Family History  (Detailed) Relationship Family Member Name Deceased Age at Death Condition Onset Age Cause of Death      Family history of Diabetes mellitus  N      Family  history of Hypertension  N    SOCIAL HISTORY  (Detailed) Tobacco use reviewed. Preferred language is Unknown.   Smoking status: Never smoker.  SMOKING STATUS Use Status Type Smoking Status Usage Per Day Years Used Total Pack Years  no/never  Never smoker             MEDICATIONS(added, continued or stopped this visit): Started Medication Directions Instruction Stopped   amlodipine 10 mg tablet take 1 tablet by oral route  every day     aspirin 81 mg chewable tablet chew 1 tablet by oral route  every day     Centrum Silver Take once daily     Cinnamon 500 mg capsule Take as directed     flaxseed oil 1,000 mg capsule Take once daily     gabapentin 600 mg tablet take 1 tablet by oral route 2 times every day     garlic Take as directed     gemfibrozil 600 mg tablet Take as directed     glimepiride 4 mg tablet Take as directed     hydrocodone-acetaminophen take 1 - 2 tablets by oral route  every 6 hours as needed for pain     Jardiance 25 mg tablet Take as directed     labetalol 300 mg tablet take 1 tablet by oral route 2 times every day  Lipitor 40 mg tablet Take as directed     losartan 100 mg-hydrochlorothiazide 25 mg tablet take 1 tablet by oral route  every day     MSM 1,000 mg tablet Take as directed     Ocuvite Lutein and Zeaxanthin Take once daily     oxybutynin chloride 5 mg tablet Take as directed     potassium 99 mg tablet Take as directed     primidone 250 mg tablet Take as directed     probiotic Take as directed     sertraline 100 mg tablet Take as directed     tamsulosin 0.4 mg capsule Take as directed     Toujeo SoloStar 300 unit/mL (1.5 mL) subcutaneous insulin pen Use as directed     Tradjenta 5 mg tablet Take as directed     Vitamin B-12 Take as directed     Vitamin D3 Take as directed       ALLERGIES: Ingredient Reaction Medication Name Comment  NO KNOWN ALLERGIES     No known allergies.    Vitals Date Temp F BP Pulse Ht In Wt Lb BMI BSA Pain  Score  05/18/2015  160/72 85 73 274 36.15  0/10     PHYSICAL EXAM General Level of Distress: no acute distress Overall Appearance: normal  Head and Face  Right Left  Fundoscopic Exam:  normal normal    Cardiovascular Cardiac: regular rate and rhythm without murmur  Right Left  Carotid Pulses: normal normal  Respiratory Lungs: clear to auscultation  Neurological Orientation: normal Recent and Remote Memory: normal Attention Span and Concentration:   normal Language: normal Fund of Knowledge: normal  Right Left Sensation: normal normal Upper Extremity Coordination: normal normal  Lower Extremity Coordination: normal normal  Musculoskeletal Gait and Station: normal  Right Left Upper Extremity Muscle Strength: normal normal Lower Extremity Muscle Strength: normal normal Upper Extremity Muscle Tone:  tremor tremor Lower Extremity Muscle Tone: normal normal  Motor Strength Upper and lower extremity motor strength was tested in the clinically pertinent muscles.     Deep Tendon Reflexes  Right Left Biceps: normal normal Triceps: normal normal Brachiloradialis: normal normal Patellar: normal normal Achilles: normal normal  Cranial Nerves II. Optic Nerve/Visual Fields: normal III. Oculomotor: normal IV. Trochlear: normal V. Trigeminal: normal VI. Abducens: normal VII. Facial: normal VIII. Acoustic/Vestibular: normal IX. Glossopharyngeal: normal X. Vagus: normal XI. Spinal Accessory: normal XII. Hypoglossal: normal  Motor and other Tests Lhermittes: negative Rhomberg: negative Pronator drift: absent     Right Left Hoffman's: normal normal Clonus: normal normal Babinski: normal normal   Additional Findings:  The patient has intention tremor right hand greater than left.  He has no rest tremor.  He has no rigidity.  He has some mild titubation of his head.    IMPRESSION The patient has right greater than left-sided tremor.  He is having  difficulty using his right hand for activities such as eating and writing.  Dr. Carles Collet has recommended proceeding with unilateral DBS.  I went over the specifics of this surgery in detail with the patient and his family.  I answered their questions.  We discussed the use of Star fix and the operation was to place the electrode and then the subsequent operation to place the implantable pulse generator.  Completed Orders (this encounter) Order Details Reason Side Interpretation Result Initial Treatment Date Region  Hypertension education Follow up with primary care physician.        Lifestyle education regarding diet Encouraged  to eat a well balanced diet and follow up with primary care physician.         Assessment/Plan # Detail Type Description   1. Assessment Essential tremor (G25.0).       2. Assessment Essential (primary) hypertension (I10).       3. Assessment Body mass index (BMI) 36.0-36.9, adult (Z68.36).   Plan Orders Today's instructions / counseling include(s) Lifestyle education regarding diet.         Pain Assessment/Treatment Pain Scale: 0/10. Method: Numeric Pain Intensity Scale.  The patient wishes to proceed with DBS surgery.  This will be scheduled after February 11 because of family scheduling concerns.  He wants his daughter to be present and therefore wants to wait until after she is available to come at that time.  The risks and benefits were discussed in detail with the patient and his family and they wished to proceed.  Orders: Instruction(s)/Education: Assessment Instruction  I10 Hypertension education  [phone] Lifestyle education regarding diet             Provider:  Marchia Meiers. Vertell Limber MD  05/23/2015 03:19 PM Dictation edited by: Marchia Meiers. Vertell Limber    CC Providers: Wells Guiles Tat [address] [place], [place] [address]              Electronically signed by Marchia Meiers Vertell Limber MD on 05/23/2015 03:19 PM

## 2015-07-16 ENCOUNTER — Encounter (HOSPITAL_COMMUNITY): Payer: Self-pay

## 2015-07-16 ENCOUNTER — Encounter (HOSPITAL_COMMUNITY)
Admission: RE | Admit: 2015-07-16 | Discharge: 2015-07-16 | Disposition: A | Discharge status: Home / Self Care | Payer: Medicare Other | Source: Ambulatory Visit | Attending: Neurosurgery | Admitting: Neurosurgery

## 2015-07-16 DIAGNOSIS — G250 Essential tremor: Secondary | ICD-10-CM | POA: Diagnosis not present

## 2015-07-16 DIAGNOSIS — E119 Type 2 diabetes mellitus without complications: Secondary | ICD-10-CM | POA: Diagnosis not present

## 2015-07-16 DIAGNOSIS — I10 Essential (primary) hypertension: Secondary | ICD-10-CM | POA: Diagnosis not present

## 2015-07-16 DIAGNOSIS — Z01812 Encounter for preprocedural laboratory examination: Secondary | ICD-10-CM | POA: Diagnosis not present

## 2015-07-16 DIAGNOSIS — Z01818 Encounter for other preprocedural examination: Secondary | ICD-10-CM | POA: Diagnosis present

## 2015-07-16 HISTORY — DX: Psoriasis, unspecified: L40.9

## 2015-07-16 HISTORY — DX: Personal history of urinary calculi: Z87.442

## 2015-07-16 HISTORY — DX: Nocturia: R35.1

## 2015-07-16 HISTORY — DX: Unspecified osteoarthritis, unspecified site: M19.90

## 2015-07-16 HISTORY — DX: Hyperlipidemia, unspecified: E78.5

## 2015-07-16 HISTORY — DX: Sleep apnea, unspecified: G47.30

## 2015-07-16 HISTORY — DX: Benign prostatic hyperplasia without lower urinary tract symptoms: N40.0

## 2015-07-16 LAB — CBC
HEMATOCRIT: 43.3 % (ref 39.0–52.0)
HEMOGLOBIN: 14.3 g/dL (ref 13.0–17.0)
MCH: 33.8 pg (ref 26.0–34.0)
MCHC: 33 g/dL (ref 30.0–36.0)
MCV: 102.4 fL — ABNORMAL HIGH (ref 78.0–100.0)
Platelets: 164 10*3/uL (ref 150–400)
RBC: 4.23 MIL/uL (ref 4.22–5.81)
RDW: 14.6 % (ref 11.5–15.5)
WBC: 6 10*3/uL (ref 4.0–10.5)

## 2015-07-16 LAB — BASIC METABOLIC PANEL
ANION GAP: 12 (ref 5–15)
BUN: 27 mg/dL — AB (ref 6–20)
CALCIUM: 9.9 mg/dL (ref 8.9–10.3)
CO2: 26 mmol/L (ref 22–32)
Chloride: 105 mmol/L (ref 101–111)
Creatinine, Ser: 1.54 mg/dL — ABNORMAL HIGH (ref 0.61–1.24)
GFR calc Af Amer: 49 mL/min — ABNORMAL LOW (ref >60)
GFR, EST NON AFRICAN AMERICAN: 42 mL/min — AB (ref >60)
GLUCOSE: 156 mg/dL — AB (ref 65–99)
POTASSIUM: 4.5 mmol/L (ref 3.5–5.1)
Sodium: 143 mmol/L (ref 135–145)

## 2015-07-16 LAB — GLUCOSE, CAPILLARY: GLUCOSE-CAPILLARY: 141 mg/dL — AB (ref 65–99)

## 2015-07-16 NOTE — Pre-Procedure Instructions (Signed)
    Logan Griffith  07/16/2015    Your procedure is scheduled on Friday, February 17.  Report to [facility] Admitting at 5:30 A.M.              Your surgery or procedure is scheduled for 7:30 AM    Call this number if you have problems the morning of surgery: [phone]                   For any other questions, please call [phone], Monday - Friday 8 AM - 4 PM.   Remember:  Do not eat food or drink liquids after midnight Thursday, February 16.  Take these medicines the morning of surgery with A SIP OF WATER:amLODipine (NORVASC), gabapentin (NEURONTIN), labetalol (NORMODYNE), sertraline (ZOLOFT), tamsulosin (FLOMAX).               Stop taking Aspirin, Coumadin, Plavix, Effient and Herbal medications.  Do not take any NSAIDs ie: Ibuprofen,  Advil,Naproxen or any medication containing Aspirin.   Do not wear jewelry, make-up or nail polish.  Do not wear lotions, powders, or perfumes.               Men may shave neck and face.  Do not bring valuables to the hospital  [facility] is not responsible for any belongings or valuables.  Contacts, dentures or bridgework may not be worn into surgery.  Leave your suitcase in the car.  After surgery it may be brought to your room.  For patients admitted to the hospital, discharge time will be determined by your treatment team.  Patients discharged the day of surgery will not be allowed to drive home.   Special instructions:  Review  [facility] - Preparing For Surgery.  Please read over the following fact sheets that you were given. Pain Booklet, Coughing and Deep Breathing and Surgical Site Infection Prevention

## 2015-07-16 NOTE — Progress Notes (Signed)
Logan Griffith reports that she is seen by a PA at [facility] in [place]. Logan Griffith denies andy chest pain or shortness of breath. Patient reports that fasting CBGs run 150- 170, I only check them in the am. Logan Griffith has a hand tremor- Right greater than left and chin tremor.Dr Tat is patients neurologist. Records (EKG) requested from [facility]

## 2015-07-16 NOTE — Progress Notes (Signed)
How to Manage Your Diabetes Before Surgery   Why is it important to control my blood sugar before and after surgery?   Improving blood sugar levels before and after surgery helps healing and can limit problems.  A way of improving blood sugar control is eating a healthy diet by:  - Eating less sugar and carbohydrates  - Increasing activity/exercise  - Talk with your doctor about reaching your blood sugar goals  High blood sugars (greater than 180 mg/dL) can raise your risk of infections and slow down your recovery so you will need to focus on controlling your diabetes during the weeks before surgery.  Make sure that the doctor who takes care of your diabetes knows about your planned surgery including the date and location.  How do I manage my blood sugars before surgery?   Check your blood sugar at least 4 times a day, 2 days before surgery to make sure that they are not too high or low.   Check your blood sugar the morning of your surgery when you wake up and every 2  hours until you get to the Short-Stay unit.  If your blood sugar is less than 70 mg/dL, you will need to treat for low blood sugar by:  Treat a low blood sugar (less than 70 mg/dL) with 1/2 cup of clear juice (cranberry or apple), 4 glucose tablets, OR glucose gel.  Recheck blood sugar in 15 minutes after treatment (to make sure it is greater than 70 mg/dL).  If blood sugar is not greater than 70 mg/dL on re-check, call [phone] for further instructions.   Report your blood sugar to the Short-Stay nurse when you get to Short-Stay.  References:  [facility], 2007 "How to Manage your Diabetes Before and After Surgery".  What do I do about my diabetes medications?   Do not take oral diabetes medicines (pills) the morning of surgery.    THE NIGHT BEFORE SURGERY, take 44 units of Toujeo  Insulin.          Do not take other diabetes injectables the day of surgery including Byetta,  Victoza, Bydureon, and Trulicity.

## 2015-07-17 LAB — HEMOGLOBIN A1C
HEMOGLOBIN A1C: 7.4 % — AB (ref 4.8–5.6)
MEAN PLASMA GLUCOSE: 166 mg/dL

## 2015-07-20 ENCOUNTER — Telehealth: Payer: Self-pay | Admitting: Neurology

## 2015-07-20 ENCOUNTER — Other Ambulatory Visit: Payer: Self-pay | Admitting: Neurosurgery

## 2015-07-20 NOTE — Telephone Encounter (Signed)
-----   Message from Milta Deiters sent at 07/20/2015 12:49 PM EST ----- Lovey Newcomer with Dr. Donald Pore office would like you to give her a call on the above patient.  There is a question about the DBS coming up on this patient. [phone] ext [phone]

## 2015-07-20 NOTE — Telephone Encounter (Signed)
Because I have never in my life had an insurance company ask for this, I didn't do this full scale.  I have parts of it but not all of it (this is not an indication for DBS to be done)

## 2015-07-20 NOTE — Telephone Encounter (Signed)
Sandy made aware.

## 2015-07-20 NOTE — Telephone Encounter (Signed)
Spoke with [place] at [facility] and she is trying to get insurance approval for DBS. She states they are requesting his tremor rating on the fahn-tolosa-maran or equivalent scale on the side being treated. Please advise. She states she has to get back to the insurance company by tomorrow at noon.

## 2015-07-24 ENCOUNTER — Ambulatory Visit (HOSPITAL_COMMUNITY): Payer: Medicare Other | Admitting: Certified Registered"

## 2015-07-24 ENCOUNTER — Encounter (HOSPITAL_COMMUNITY)
Admission: RE | Disposition: A | Discharge status: Home / Self Care | Payer: Self-pay | Source: Ambulatory Visit | Attending: Neurosurgery

## 2015-07-24 ENCOUNTER — Encounter (HOSPITAL_COMMUNITY): Payer: Self-pay | Admitting: Certified Registered"

## 2015-07-24 ENCOUNTER — Ambulatory Visit (HOSPITAL_COMMUNITY)
Admission: RE | Admit: 2015-07-24 | Discharge: 2015-07-24 | Disposition: A | Discharge status: Home / Self Care | Payer: Medicare Other | Source: Ambulatory Visit | Attending: Neurosurgery | Admitting: Neurosurgery

## 2015-07-24 DIAGNOSIS — G4730 Sleep apnea, unspecified: Secondary | ICD-10-CM | POA: Diagnosis not present

## 2015-07-24 DIAGNOSIS — Z87891 Personal history of nicotine dependence: Secondary | ICD-10-CM | POA: Insufficient documentation

## 2015-07-24 DIAGNOSIS — Z794 Long term (current) use of insulin: Secondary | ICD-10-CM | POA: Insufficient documentation

## 2015-07-24 DIAGNOSIS — Z7982 Long term (current) use of aspirin: Secondary | ICD-10-CM | POA: Insufficient documentation

## 2015-07-24 DIAGNOSIS — F329 Major depressive disorder, single episode, unspecified: Secondary | ICD-10-CM | POA: Diagnosis not present

## 2015-07-24 DIAGNOSIS — I10 Essential (primary) hypertension: Secondary | ICD-10-CM | POA: Diagnosis not present

## 2015-07-24 DIAGNOSIS — Z9689 Presence of other specified functional implants: Secondary | ICD-10-CM

## 2015-07-24 DIAGNOSIS — Z79899 Other long term (current) drug therapy: Secondary | ICD-10-CM | POA: Diagnosis not present

## 2015-07-24 DIAGNOSIS — M1990 Unspecified osteoarthritis, unspecified site: Secondary | ICD-10-CM | POA: Insufficient documentation

## 2015-07-24 DIAGNOSIS — G250 Essential tremor: Secondary | ICD-10-CM | POA: Insufficient documentation

## 2015-07-24 DIAGNOSIS — E119 Type 2 diabetes mellitus without complications: Secondary | ICD-10-CM | POA: Insufficient documentation

## 2015-07-24 DIAGNOSIS — R251 Tremor, unspecified: Secondary | ICD-10-CM

## 2015-07-24 HISTORY — PX: CARPAL TUNNEL RELEASE: SHX101

## 2015-07-24 LAB — GLUCOSE, CAPILLARY: Glucose-Capillary: 169 mg/dL — ABNORMAL HIGH (ref 65–99)

## 2015-07-24 SURGERY — CARPAL TUNNEL RELEASE
Anesthesia: Monitor Anesthesia Care | Site: Head

## 2015-07-24 MED ORDER — FENTANYL CITRATE (PF) 250 MCG/5ML IJ SOLN
INTRAMUSCULAR | Status: AC
  Filled 2015-07-24: qty 5

## 2015-07-24 MED ORDER — LACTATED RINGERS IV SOLN
INTRAVENOUS | Status: DC | PRN
  Administered 2015-07-24: 08:00:00 via INTRAVENOUS

## 2015-07-24 MED ORDER — LIDOCAINE-EPINEPHRINE 1 %-1:100000 IJ SOLN
INTRAMUSCULAR | Status: DC | PRN
  Administered 2015-07-24: 17 mL

## 2015-07-24 MED ORDER — PROPOFOL 10 MG/ML IV BOLUS
INTRAVENOUS | Status: AC
  Filled 2015-07-24: qty 20

## 2015-07-24 MED ORDER — CEFAZOLIN SODIUM-DEXTROSE 2-3 GM-% IV SOLR
INTRAVENOUS | Status: AC
  Administered 2015-07-24: 2 g via INTRAVENOUS
  Filled 2015-07-24: qty 50

## 2015-07-24 MED ORDER — MIDAZOLAM HCL 2 MG/2ML IJ SOLN
INTRAMUSCULAR | Status: AC
  Filled 2015-07-24: qty 2

## 2015-07-24 SURGICAL SUPPLY — 36 items
BANDAGE GAUZE 4  KLING STR (GAUZE/BANDAGES/DRESSINGS) IMPLANT
BLADE SURG 15 STRL LF DISP TIS (BLADE) ×1 IMPLANT
BLADE SURG 15 STRL SS (BLADE) ×1
BNDG GAUZE ELAST 4 BULKY (GAUZE/BANDAGES/DRESSINGS) IMPLANT
CORDS BIPOLAR (ELECTRODE) IMPLANT
DECANTER SPIKE VIAL GLASS SM (MISCELLANEOUS) IMPLANT
DRAPE EXTREMITY T 121X128X90 (DRAPE) IMPLANT
DRIVER POWER DISP BLUE (INSTRUMENTS) ×2 IMPLANT
GAUZE SPONGE 4X4 12PLY STRL (GAUZE/BANDAGES/DRESSINGS) ×2 IMPLANT
GAUZE SPONGE 4X4 16PLY XRAY LF (GAUZE/BANDAGES/DRESSINGS) IMPLANT
GLOVE BIO SURGEON STRL SZ8 (GLOVE) ×2 IMPLANT
GLOVE BIOGEL PI IND STRL 8.5 (GLOVE) ×1 IMPLANT
GLOVE BIOGEL PI INDICATOR 8.5 (GLOVE) ×1
GLOVE EXAM NITRILE LRG STRL (GLOVE) IMPLANT
GLOVE EXAM NITRILE MD LF STRL (GLOVE) IMPLANT
GLOVE EXAM NITRILE XL STR (GLOVE) IMPLANT
GLOVE EXAM NITRILE XS STR PU (GLOVE) IMPLANT
GOWN STRL REUS W/ TWL LRG LVL3 (GOWN DISPOSABLE) IMPLANT
GOWN STRL REUS W/ TWL XL LVL3 (GOWN DISPOSABLE) IMPLANT
GOWN STRL REUS W/TWL 2XL LVL3 (GOWN DISPOSABLE) IMPLANT
GOWN STRL REUS W/TWL LRG LVL3 (GOWN DISPOSABLE)
GOWN STRL REUS W/TWL XL LVL3 (GOWN DISPOSABLE)
KIT BASIN OR (CUSTOM PROCEDURE TRAY) ×2 IMPLANT
KIT ROOM TURNOVER OR (KITS) ×2 IMPLANT
NEEDLE HYPO 25X1 1.5 SAFETY (NEEDLE) ×2 IMPLANT
NS IRRIG 1000ML POUR BTL (IV SOLUTION) ×2 IMPLANT
PACK SURGICAL SETUP 50X90 (CUSTOM PROCEDURE TRAY) ×2 IMPLANT
PAD ARMBOARD 7.5X6 YLW CONV (MISCELLANEOUS) IMPLANT
STOCKINETTE 4X48 STRL (DRAPES) IMPLANT
SUT ETHILON 3 0 PS 1 (SUTURE) ×12 IMPLANT
SYR BULB 3OZ (MISCELLANEOUS) ×2 IMPLANT
SYR CONTROL 10ML LL (SYRINGE) ×4 IMPLANT
TOWEL OR 17X24 6PK STRL BLUE (TOWEL DISPOSABLE) ×2 IMPLANT
TOWEL OR 17X26 10 PK STRL BLUE (TOWEL DISPOSABLE) ×2 IMPLANT
UNDERPAD 30X30 INCONTINENT (UNDERPADS AND DIAPERS) IMPLANT
WATER STERILE IRR 1000ML POUR (IV SOLUTION) ×2 IMPLANT

## 2015-07-24 NOTE — Interval H&P Note (Signed)
History and Physical Interval Note:  07/24/2015 7:28 AM  Logan Griffith  has presented today for surgery, with the diagnosis of Essential tremor  The various methods of treatment have been discussed with the patient and family. After consideration of risks, benefits and other options for treatment, the patient has consented to  Procedure(s) with comments: Fiducial placement for deep brain stimulator (N/A) - Fiducial placement for deep brain stimulator as a surgical intervention .  The patient's history has been reviewed, patient examined, no change in status, stable for surgery.  I have reviewed the patient's chart and labs.  Questions were answered to the patient's satisfaction.     Nansi Birmingham D

## 2015-07-24 NOTE — Brief Op Note (Signed)
07/24/2015  8:07 AM  PATIENT:  Logan Griffith  75 y.o. male  PRE-OPERATIVE DIAGNOSIS:  Essential tremor  POST-OPERATIVE DIAGNOSIS:  Essential tremor  PROCEDURE:  Procedure(s) with comments: Fiducial placement for deep brain stimulator (N/A) - Fiducial placement for deep brain stimulator  SURGEON:  Surgeon(s) and Role:    * Erline Levine, MD - Primary  PHYSICIAN ASSISTANT:   ASSISTANTS: Poteat, RN   ANESTHESIA:   local  EBL:     BLOOD ADMINISTERED:none  DRAINS: none   LOCAL MEDICATIONS USED:  LIDOCAINE   SPECIMEN:  No Specimen  DISPOSITION OF SPECIMEN:  N/A  COUNTS:  YES  TOURNIQUET:  * No tourniquets in log *  DICTATION:   Indications:  Patient has essential tremor and presents for Star Fix Fiducial Placement for upcoming DBS VIM placement.    Procedure:  Patient was brought to the operating room.  His scalp had been shaved.  Areas of planned fiducial placement were marked, scalp was prepped with Duraprep.  Scalp was infiltrated with lidocaine with epinephrine.  Four fiducials were placed according to standard landmarks through stab incisions.  3-0 Nylon sutures were placed and sterile dressings were applied.  Patient tolerated procedure well.  He was taken to recovery.  PLAN OF CARE: Discharge to home after PACU  PATIENT DISPOSITION:  PACU - hemodynamically stable.   Delay start of Pharmacological VTE agent (>24hrs) due to surgical blood loss or risk of bleeding: yes

## 2015-07-24 NOTE — Anesthesia Postprocedure Evaluation (Signed)
Anesthesia Post Note  Patient: Logan Griffith  Procedure(s) Performed: Procedure(s) (LRB): Fiducial placement for deep brain stimulator (N/A)  Patient location during evaluation: PACU Anesthesia Type: MAC Level of consciousness: awake and alert Pain management: pain level controlled Vital Signs Assessment: post-procedure vital signs reviewed and stable Respiratory status: spontaneous breathing, nonlabored ventilation and respiratory function stable Cardiovascular status: stable and blood pressure returned to baseline Anesthetic complications: no    Last Vitals:  Filed Vitals:   07/24/15 0642  BP: 143/62  Pulse: 71  Temp: 36.9 C  Resp: 18    Last Pain: There were no vitals filed for this visit.               Zenaida Deed

## 2015-07-24 NOTE — Anesthesia Preprocedure Evaluation (Addendum)
Anesthesia Evaluation  Patient identified by MRN, date of birth, ID band Patient awake    Reviewed: Allergy & Precautions, H&P , NPO status , Patient's Chart, lab work & pertinent test results  History of Anesthesia Complications Negative for: history of anesthetic complications  Airway Mallampati: II  TM Distance: >3 FB Neck ROM: full    Dental  (+) Lower Dentures, Teeth Intact   Pulmonary sleep apnea , former smoker,    Pulmonary exam normal breath sounds clear to auscultation       Cardiovascular hypertension, Pt. on medications Normal cardiovascular exam Rhythm:regular Rate:Normal     Neuro/Psych PSYCHIATRIC DISORDERS Depression  Neuromuscular disease    GI/Hepatic negative GI ROS, Neg liver ROS,   Endo/Other  negative endocrine ROSdiabetes  Renal/GU negative Renal ROS     Musculoskeletal  (+) Arthritis ,   Abdominal   Peds  Hematology negative hematology ROS (+)   Anesthesia Other Findings tremor  Reproductive/Obstetrics negative OB ROS                           Anesthesia Physical Anesthesia Plan  ASA: III  Anesthesia Plan: MAC   Post-op Pain Management:    Induction: Intravenous  Airway Management Planned: Simple Face Mask  Additional Equipment:   Intra-op Plan:   Post-operative Plan:   Informed Consent: I have reviewed the patients History and Physical, chart, labs and discussed the procedure including the risks, benefits and alternatives for the proposed anesthesia with the patient or authorized representative who has indicated his/her understanding and acceptance.   Dental Advisory Given  Plan Discussed with: Anesthesiologist, CRNA and Surgeon  Anesthesia Plan Comments: (Per Dr. Vertell Limber will provide minimal sedation if needed, otherwise, straight monitored anesthesia care)       Anesthesia Quick Evaluation

## 2015-07-24 NOTE — Transfer of Care (Signed)
Immediate Anesthesia Transfer of Care Note  Patient: Logan Griffith  Procedure(s) Performed: Procedure(s) with comments: Fiducial placement for deep brain stimulator (N/A) - Fiducial placement for deep brain stimulator  Patient Location: PACU  Anesthesia Type:MAC  Level of Consciousness: awake, alert  and oriented  Airway & Oxygen Therapy: Patient Spontanous Breathing  Post-op Assessment: Report given to RN, Post -op Vital signs reviewed and stable and Patient moving all extremities X 4  Post vital signs: Reviewed and stable  Last Vitals:  Filed Vitals:   07/24/15 0642  BP: 143/62  Pulse: 71  Temp: 36.9 C  Resp: 18    Complications: No apparent anesthesia complications

## 2015-07-27 ENCOUNTER — Encounter (HOSPITAL_COMMUNITY): Payer: Self-pay | Admitting: Neurosurgery

## 2015-07-27 NOTE — Op Note (Signed)
07/24/2015  8:07 AM  PATIENT: Logan Griffith 75 y.o. male  PRE-OPERATIVE DIAGNOSIS: Essential tremor  POST-OPERATIVE DIAGNOSIS: Essential tremor  PROCEDURE: Procedure(s) with comments: Fiducial placement for deep brain stimulator (N/A) - Fiducial placement for deep brain stimulator  SURGEON: Surgeon(s) and Role:  * Erline Levine, MD - Primary  PHYSICIAN ASSISTANT:   ASSISTANTS: Poteat, RN   ANESTHESIA: local  EBL:    BLOOD ADMINISTERED:none  DRAINS: none   LOCAL MEDICATIONS USED: LIDOCAINE   SPECIMEN: No Specimen  DISPOSITION OF SPECIMEN: N/A  COUNTS: YES  TOURNIQUET: * No tourniquets in log *  DICTATION:   Indications: Patient has essential tremor and presents for Star Fix Fiducial Placement for upcoming DBS VIM placement.   Procedure: Patient was brought to the operating room. His scalp had been shaved. Areas of planned fiducial placement were marked, scalp was prepped with Duraprep. Scalp was infiltrated with lidocaine with epinephrine. Four fiducials were placed according to standard landmarks through stab incisions. 3-0 Nylon sutures were placed and sterile dressings were applied. Patient tolerated procedure well. He was taken to recovery.  PLAN OF CARE: Discharge to home after PACU  PATIENT DISPOSITION: PACU - hemodynamically stable.  Delay start of Pharmacological VTE agent (>24hrs) due to surgical blood loss or risk of bleeding: yes

## 2015-07-30 MED ORDER — ONDANSETRON HCL 4 MG/2ML IJ SOLN
4.0000 mg | Freq: Once | INTRAMUSCULAR | Status: DC | PRN
Start: 2015-07-30 — End: 2015-07-30

## 2015-07-30 MED ORDER — FENTANYL CITRATE (PF) 100 MCG/2ML IJ SOLN: 25.0000 ug | INTRAMUSCULAR | Status: DC | PRN

## 2015-07-30 MED ORDER — HYDROCODONE-ACETAMINOPHEN 7.5-325 MG PO TABS: 1.0000 | ORAL_TABLET | Freq: Once | ORAL | Status: DC | PRN

## 2015-07-30 MED ORDER — CEFAZOLIN SODIUM-DEXTROSE 2-3 GM-% IV SOLR
2.0000 g | INTRAVENOUS | Status: AC
  Administered 2015-07-31: 2 g via INTRAVENOUS
  Filled 2015-07-30 (×2): qty 50

## 2015-07-30 NOTE — Progress Notes (Signed)
Logan Griffith reports that CBG this am was 170.  Patient instructed to take 44 units of  Toujeo  tonight and no medication for diabetes in the am.

## 2015-07-31 ENCOUNTER — Ambulatory Visit (HOSPITAL_COMMUNITY): Admit: 2015-07-31 | Payer: Medicare Other

## 2015-07-31 ENCOUNTER — Inpatient Hospital Stay (HOSPITAL_COMMUNITY): Payer: Medicare Other | Admitting: Anesthesiology

## 2015-07-31 ENCOUNTER — Inpatient Hospital Stay (HOSPITAL_COMMUNITY)
Admission: AD | Admit: 2015-07-31 | Discharge: 2015-08-01 | DRG: 027 | Disposition: A | Discharge status: Home / Self Care | Payer: Medicare Other | Source: Ambulatory Visit | Attending: Neurosurgery | Admitting: Neurosurgery

## 2015-07-31 ENCOUNTER — Encounter (HOSPITAL_COMMUNITY): Payer: Self-pay | Admitting: *Deleted

## 2015-07-31 ENCOUNTER — Inpatient Hospital Stay (HOSPITAL_COMMUNITY)
Admit: 2015-07-31 | Discharge: 2015-07-31 | Disposition: A | Discharge status: Home / Self Care | Payer: Medicare Other | Attending: Neurology | Admitting: Neurology

## 2015-07-31 ENCOUNTER — Encounter (HOSPITAL_COMMUNITY)
Admission: AD | Disposition: A | Discharge status: Home / Self Care | Payer: Self-pay | Source: Ambulatory Visit | Attending: Neurosurgery

## 2015-07-31 DIAGNOSIS — E7800 Pure hypercholesterolemia, unspecified: Secondary | ICD-10-CM | POA: Diagnosis present

## 2015-07-31 DIAGNOSIS — F329 Major depressive disorder, single episode, unspecified: Secondary | ICD-10-CM | POA: Diagnosis present

## 2015-07-31 DIAGNOSIS — Z7984 Long term (current) use of oral hypoglycemic drugs: Secondary | ICD-10-CM

## 2015-07-31 DIAGNOSIS — Z87891 Personal history of nicotine dependence: Secondary | ICD-10-CM | POA: Diagnosis not present

## 2015-07-31 DIAGNOSIS — M1990 Unspecified osteoarthritis, unspecified site: Secondary | ICD-10-CM | POA: Diagnosis present

## 2015-07-31 DIAGNOSIS — Z7982 Long term (current) use of aspirin: Secondary | ICD-10-CM

## 2015-07-31 DIAGNOSIS — G4730 Sleep apnea, unspecified: Secondary | ICD-10-CM | POA: Diagnosis present

## 2015-07-31 DIAGNOSIS — R251 Tremor, unspecified: Secondary | ICD-10-CM | POA: Diagnosis present

## 2015-07-31 DIAGNOSIS — I10 Essential (primary) hypertension: Secondary | ICD-10-CM | POA: Diagnosis present

## 2015-07-31 DIAGNOSIS — E119 Type 2 diabetes mellitus without complications: Secondary | ICD-10-CM | POA: Diagnosis present

## 2015-07-31 DIAGNOSIS — G250 Essential tremor: Secondary | ICD-10-CM | POA: Diagnosis present

## 2015-07-31 DIAGNOSIS — Z9689 Presence of other specified functional implants: Secondary | ICD-10-CM

## 2015-07-31 HISTORY — PX: SUBTHALAMIC STIMULATOR INSERTION: SHX5375

## 2015-07-31 LAB — CBC
HCT: 40.6 % (ref 39.0–52.0)
HEMOGLOBIN: 13.7 g/dL (ref 13.0–17.0)
MCH: 34.5 pg — ABNORMAL HIGH (ref 26.0–34.0)
MCHC: 33.7 g/dL (ref 30.0–36.0)
MCV: 102.3 fL — ABNORMAL HIGH (ref 78.0–100.0)
Platelets: 156 10*3/uL (ref 150–400)
RBC: 3.97 MIL/uL — AB (ref 4.22–5.81)
RDW: 14.4 % (ref 11.5–15.5)
WBC: 5.8 10*3/uL (ref 4.0–10.5)

## 2015-07-31 LAB — BASIC METABOLIC PANEL
ANION GAP: 11 (ref 5–15)
BUN: 35 mg/dL — ABNORMAL HIGH (ref 6–20)
CALCIUM: 9.3 mg/dL (ref 8.9–10.3)
CO2: 21 mmol/L — ABNORMAL LOW (ref 22–32)
Chloride: 107 mmol/L (ref 101–111)
Creatinine, Ser: 1.59 mg/dL — ABNORMAL HIGH (ref 0.61–1.24)
GFR, EST AFRICAN AMERICAN: 47 mL/min — AB (ref >60)
GFR, EST NON AFRICAN AMERICAN: 41 mL/min — AB (ref >60)
Glucose, Bld: 154 mg/dL — ABNORMAL HIGH (ref 65–99)
Potassium: 4 mmol/L (ref 3.5–5.1)
SODIUM: 139 mmol/L (ref 135–145)

## 2015-07-31 LAB — GLUCOSE, CAPILLARY
GLUCOSE-CAPILLARY: 173 mg/dL — AB (ref 65–99)
GLUCOSE-CAPILLARY: 132 mg/dL — AB (ref 65–99)
GLUCOSE-CAPILLARY: 124 mg/dL — AB (ref 65–99)
Glucose-Capillary: 183 mg/dL — ABNORMAL HIGH (ref 65–99)

## 2015-07-31 SURGERY — SUBTHALAMIC STIMULATOR INSERTION
Anesthesia: General | Laterality: Left

## 2015-07-31 MED ORDER — OXYCODONE-ACETAMINOPHEN 5-325 MG PO TABS: 1.0000 | ORAL_TABLET | ORAL | Status: DC | PRN

## 2015-07-31 MED ORDER — HYDROCODONE-ACETAMINOPHEN 5-325 MG PO TABS: 1.0000 | ORAL_TABLET | ORAL | Status: DC | PRN

## 2015-07-31 MED ORDER — GABAPENTIN 600 MG PO TABS
600.0000 mg | ORAL_TABLET | Freq: Two times a day (BID) | ORAL | Status: DC
  Administered 2015-07-31: 600 mg via ORAL
  Filled 2015-07-31: qty 1

## 2015-07-31 MED ORDER — LIDOCAINE-EPINEPHRINE 1 %-1:100000 IJ SOLN
INTRAMUSCULAR | Status: DC | PRN
  Administered 2015-07-31: 29.5 mL

## 2015-07-31 MED ORDER — THROMBIN 5000 UNITS EX SOLR
CUTANEOUS | Status: DC | PRN
  Administered 2015-07-31: 09:00:00 via TOPICAL

## 2015-07-31 MED ORDER — MIDAZOLAM HCL 2 MG/2ML IJ SOLN
INTRAMUSCULAR | Status: AC
  Filled 2015-07-31: qty 2

## 2015-07-31 MED ORDER — SODIUM BICARBONATE 4 % IV SOLN
INTRAVENOUS | Status: DC | PRN
  Administered 2015-07-31: 5.5 mL via SUBCUTANEOUS

## 2015-07-31 MED ORDER — ONDANSETRON HCL 4 MG/2ML IJ SOLN: 4.0000 mg | Freq: Once | INTRAMUSCULAR | Status: DC | PRN

## 2015-07-31 MED ORDER — ATORVASTATIN CALCIUM 20 MG PO TABS: 40.0000 mg | ORAL_TABLET | Freq: Every day | ORAL | Status: DC

## 2015-07-31 MED ORDER — 0.9 % SODIUM CHLORIDE (POUR BTL) OPTIME
TOPICAL | Status: DC | PRN
  Administered 2015-07-31: 1000 mL

## 2015-07-31 MED ORDER — AMLODIPINE BESYLATE 10 MG PO TABS
10.0000 mg | ORAL_TABLET | Freq: Every day | ORAL | Status: DC
  Filled 2015-07-31: qty 1

## 2015-07-31 MED ORDER — PROPOFOL 10 MG/ML IV BOLUS
INTRAVENOUS | Status: AC
  Filled 2015-07-31: qty 20

## 2015-07-31 MED ORDER — ASPIRIN 81 MG PO CHEW
81.0000 mg | CHEWABLE_TABLET | Freq: Every day | ORAL | Status: DC
  Filled 2015-07-31: qty 1

## 2015-07-31 MED ORDER — SODIUM CHLORIDE 0.9 % IV SOLN: 250.0000 mL | INTRAVENOUS | Status: DC

## 2015-07-31 MED ORDER — METHOCARBAMOL 1000 MG/10ML IJ SOLN: 500.0000 mg | Freq: Four times a day (QID) | INTRAMUSCULAR | Status: DC | PRN

## 2015-07-31 MED ORDER — BACITRACIN ZINC 500 UNIT/GM EX OINT
TOPICAL_OINTMENT | CUTANEOUS | Status: DC | PRN
Start: 2015-07-31 — End: 2015-07-31
  Administered 2015-07-31: 1 via TOPICAL

## 2015-07-31 MED ORDER — SODIUM CHLORIDE 0.9% FLUSH
3.0000 mL | Freq: Two times a day (BID) | INTRAVENOUS | Status: DC
  Administered 2015-07-31: 3 mL via INTRAVENOUS

## 2015-07-31 MED ORDER — DEXMEDETOMIDINE HCL IN NACL 200 MCG/50ML IV SOLN
INTRAVENOUS | Status: AC
Start: 2015-07-31 — End: 2015-07-31
  Filled 2015-07-31: qty 50

## 2015-07-31 MED ORDER — ALUM & MAG HYDROXIDE-SIMETH 200-200-20 MG/5ML PO SUSP: 30.0000 mL | Freq: Four times a day (QID) | ORAL | Status: DC | PRN

## 2015-07-31 MED ORDER — GLIMEPIRIDE 4 MG PO TABS
4.0000 mg | ORAL_TABLET | Freq: Every day | ORAL | Status: DC
Start: 2015-08-01 — End: 2015-08-01
  Administered 2015-08-01: 4 mg via ORAL
  Filled 2015-07-31: qty 1

## 2015-07-31 MED ORDER — ONDANSETRON HCL 4 MG/2ML IJ SOLN: 4.0000 mg | INTRAMUSCULAR | Status: DC | PRN

## 2015-07-31 MED ORDER — LOSARTAN POTASSIUM 50 MG PO TABS
100.0000 mg | ORAL_TABLET | Freq: Every day | ORAL | Status: DC
  Administered 2015-07-31: 100 mg via ORAL
  Filled 2015-07-31: qty 2

## 2015-07-31 MED ORDER — GEMFIBROZIL 600 MG PO TABS
600.0000 mg | ORAL_TABLET | Freq: Two times a day (BID) | ORAL | Status: DC
  Administered 2015-07-31 – 2015-08-01 (×2): 600 mg via ORAL
  Filled 2015-07-31 (×2): qty 1

## 2015-07-31 MED ORDER — LOSARTAN POTASSIUM-HCTZ 100-25 MG PO TABS: 1.0000 | ORAL_TABLET | Freq: Every day | ORAL | Status: DC

## 2015-07-31 MED ORDER — MENTHOL 3 MG MT LOZG
1.0000 | LOZENGE | OROMUCOSAL | Status: DC | PRN
Start: 2015-07-31 — End: 2015-08-01

## 2015-07-31 MED ORDER — PRIMIDONE 250 MG PO TABS
250.0000 mg | ORAL_TABLET | Freq: Every day | ORAL | Status: DC
  Filled 2015-07-31: qty 1

## 2015-07-31 MED ORDER — VITAMIN D 1000 UNITS PO TABS: 3000.0000 [IU] | ORAL_TABLET | Freq: Every day | ORAL | Status: DC

## 2015-07-31 MED ORDER — PANTOPRAZOLE SODIUM 40 MG IV SOLR
40.0000 mg | Freq: Every day | INTRAVENOUS | Status: DC
  Administered 2015-07-31: 40 mg via INTRAVENOUS
  Filled 2015-07-31: qty 40

## 2015-07-31 MED ORDER — HEMOSTATIC AGENTS (NO CHARGE) OPTIME
TOPICAL | Status: DC | PRN
  Administered 2015-07-31 (×2): 1 via TOPICAL

## 2015-07-31 MED ORDER — POTASSIUM 99 MG PO TABS: 99.0000 mg | ORAL_TABLET | Freq: Every day | ORAL | Status: DC

## 2015-07-31 MED ORDER — DEXMEDETOMIDINE HCL 200 MCG/2ML IV SOLN
INTRAVENOUS | Status: DC | PRN
  Administered 2015-07-31 (×3): 8 ug via INTRAVENOUS

## 2015-07-31 MED ORDER — LINAGLIPTIN 5 MG PO TABS
5.0000 mg | ORAL_TABLET | Freq: Every day | ORAL | Status: DC
  Filled 2015-07-31: qty 1

## 2015-07-31 MED ORDER — HYDROCHLOROTHIAZIDE 25 MG PO TABS
25.0000 mg | ORAL_TABLET | Freq: Every day | ORAL | Status: DC
Start: 2015-07-31 — End: 2015-08-01
  Administered 2015-07-31: 25 mg via ORAL
  Filled 2015-07-31: qty 1

## 2015-07-31 MED ORDER — LABETALOL HCL 300 MG PO TABS: 300.0000 mg | ORAL_TABLET | Freq: Two times a day (BID) | ORAL | Status: DC

## 2015-07-31 MED ORDER — BISACODYL 10 MG RE SUPP: 10.0000 mg | Freq: Every day | RECTAL | Status: DC | PRN

## 2015-07-31 MED ORDER — ADULT MULTIVITAMIN W/MINERALS CH
1.0000 | ORAL_TABLET | Freq: Every day | ORAL | Status: DC
  Administered 2015-07-31: 1 via ORAL
  Filled 2015-07-31: qty 1

## 2015-07-31 MED ORDER — VITAMIN B-12 1000 MCG PO TABS
1000.0000 ug | ORAL_TABLET | Freq: Every day | ORAL | Status: DC
  Filled 2015-07-31: qty 1

## 2015-07-31 MED ORDER — LACTATED RINGERS IV SOLN
INTRAVENOUS | Status: DC | PRN
  Administered 2015-07-31: 08:00:00 via INTRAVENOUS

## 2015-07-31 MED ORDER — THROMBIN 5000 UNITS EX SOLR
CUTANEOUS | Status: DC | PRN
  Administered 2015-07-31 (×2): 5000 [IU] via TOPICAL

## 2015-07-31 MED ORDER — PROPOFOL 10 MG/ML IV BOLUS
INTRAVENOUS | Status: DC | PRN
  Administered 2015-07-31 (×3): 10 mg via INTRAVENOUS

## 2015-07-31 MED ORDER — BUPIVACAINE HCL 0.5 % IJ SOLN
INTRAMUSCULAR | Status: DC | PRN
  Administered 2015-07-31: 29.5 mL

## 2015-07-31 MED ORDER — OXYBUTYNIN CHLORIDE 5 MG PO TABS
5.0000 mg | ORAL_TABLET | Freq: Three times a day (TID) | ORAL | Status: DC
  Administered 2015-07-31: 5 mg via ORAL
  Filled 2015-07-31 (×3): qty 1

## 2015-07-31 MED ORDER — TAMSULOSIN HCL 0.4 MG PO CAPS
0.4000 mg | ORAL_CAPSULE | Freq: Every day | ORAL | Status: DC
  Administered 2015-07-31: 0.4 mg via ORAL
  Filled 2015-07-31: qty 1

## 2015-07-31 MED ORDER — INSULIN GLARGINE 100 UNIT/ML ~~LOC~~ SOLN
44.0000 [IU] | Freq: Every day | SUBCUTANEOUS | Status: DC
  Administered 2015-07-31: 44 [IU] via SUBCUTANEOUS
  Filled 2015-07-31 (×2): qty 0.44

## 2015-07-31 MED ORDER — CEPHALEXIN 500 MG PO CAPS
500.0000 mg | ORAL_CAPSULE | Freq: Three times a day (TID) | ORAL | Status: DC
  Administered 2015-07-31 (×2): 500 mg via ORAL
  Filled 2015-07-31 (×3): qty 1

## 2015-07-31 MED ORDER — DOCUSATE SODIUM 100 MG PO CAPS
100.0000 mg | ORAL_CAPSULE | Freq: Two times a day (BID) | ORAL | Status: DC
  Administered 2015-07-31: 100 mg via ORAL
  Filled 2015-07-31: qty 1

## 2015-07-31 MED ORDER — SERTRALINE HCL 50 MG PO TABS: 100.0000 mg | ORAL_TABLET | Freq: Every day | ORAL | Status: DC

## 2015-07-31 MED ORDER — ACETAMINOPHEN 325 MG PO TABS
650.0000 mg | ORAL_TABLET | ORAL | Status: DC | PRN
  Administered 2015-07-31: 650 mg via ORAL
  Filled 2015-07-31: qty 2

## 2015-07-31 MED ORDER — FENTANYL CITRATE (PF) 250 MCG/5ML IJ SOLN
INTRAMUSCULAR | Status: AC
Start: 2015-07-31 — End: 2015-07-31
  Filled 2015-07-31: qty 5

## 2015-07-31 MED ORDER — HYDROMORPHONE HCL 1 MG/ML IJ SOLN: 0.5000 mg | INTRAMUSCULAR | Status: DC | PRN

## 2015-07-31 MED ORDER — METHOCARBAMOL 500 MG PO TABS: 500.0000 mg | ORAL_TABLET | Freq: Four times a day (QID) | ORAL | Status: DC | PRN

## 2015-07-31 MED ORDER — CLOTRIMAZOLE 1 % EX CREA
1.0000 "application " | TOPICAL_CREAM | Freq: Two times a day (BID) | CUTANEOUS | Status: DC
  Administered 2015-07-31: 1 via TOPICAL
  Filled 2015-07-31: qty 15

## 2015-07-31 MED ORDER — FLAXSEED OIL 1000 MG PO CAPS: 1000.0000 mg | ORAL_CAPSULE | Freq: Every day | ORAL | Status: DC

## 2015-07-31 MED ORDER — PHENOL 1.4 % MT LIQD: 1.0000 | OROMUCOSAL | Status: DC | PRN

## 2015-07-31 MED ORDER — HYDROCODONE-ACETAMINOPHEN 5-325 MG PO TABS
1.0000 | ORAL_TABLET | ORAL | Status: DC | PRN
  Administered 2015-07-31 – 2015-08-01 (×2): 2 via ORAL
  Administered 2015-08-01: 1 via ORAL
  Filled 2015-07-31 (×2): qty 2
  Filled 2015-07-31: qty 1

## 2015-07-31 MED ORDER — POLYETHYLENE GLYCOL 3350 17 G PO PACK: 17.0000 g | PACK | Freq: Every day | ORAL | Status: DC | PRN

## 2015-07-31 MED ORDER — PIMECROLIMUS 1 % EX CREA: 1.0000 "application " | TOPICAL_CREAM | Freq: Every day | CUTANEOUS | Status: DC | PRN

## 2015-07-31 MED ORDER — KCL IN DEXTROSE-NACL 20-5-0.45 MEQ/L-%-% IV SOLN: INTRAVENOUS | Status: DC

## 2015-07-31 MED ORDER — CANAGLIFLOZIN 100 MG PO TABS
100.0000 mg | ORAL_TABLET | Freq: Every day | ORAL | Status: DC
  Administered 2015-08-01: 100 mg via ORAL
  Filled 2015-07-31: qty 1

## 2015-07-31 MED ORDER — FENTANYL CITRATE (PF) 100 MCG/2ML IJ SOLN: 25.0000 ug | INTRAMUSCULAR | Status: DC | PRN

## 2015-07-31 MED ORDER — CEFAZOLIN SODIUM-DEXTROSE 2-3 GM-% IV SOLR
2.0000 g | Freq: Three times a day (TID) | INTRAVENOUS | Status: AC
  Administered 2015-07-31 (×2): 2 g via INTRAVENOUS
  Filled 2015-07-31 (×2): qty 50

## 2015-07-31 MED ORDER — OXYCODONE HCL 5 MG PO TABS: 5.0000 mg | ORAL_TABLET | Freq: Once | ORAL | Status: DC | PRN

## 2015-07-31 MED ORDER — ACETAMINOPHEN 650 MG RE SUPP: 650.0000 mg | RECTAL | Status: DC | PRN

## 2015-07-31 MED ORDER — PROSIGHT PO TABS
1.0000 | ORAL_TABLET | Freq: Every day | ORAL | Status: DC
  Administered 2015-07-31: 1 via ORAL
  Filled 2015-07-31 (×2): qty 1

## 2015-07-31 MED ORDER — FINASTERIDE 5 MG PO TABS
5.0000 mg | ORAL_TABLET | Freq: Every day | ORAL | Status: DC
  Filled 2015-07-31: qty 1

## 2015-07-31 MED ORDER — DEXMEDETOMIDINE HCL IN NACL 200 MCG/50ML IV SOLN
INTRAVENOUS | Status: DC | PRN
Start: 2015-07-31 — End: 2015-07-31
  Administered 2015-07-31: .5 ug/kg/h via INTRAVENOUS

## 2015-07-31 MED ORDER — SODIUM CHLORIDE 0.9% FLUSH: 3.0000 mL | INTRAVENOUS | Status: DC | PRN

## 2015-07-31 MED ORDER — OXYCODONE HCL 5 MG/5ML PO SOLN: 5.0000 mg | Freq: Once | ORAL | Status: DC | PRN

## 2015-07-31 SURGICAL SUPPLY — 83 items
BANDAGE ADH SHEER 1  50/CT (GAUZE/BANDAGES/DRESSINGS) IMPLANT
BENZOIN TINCTURE PRP APPL 2/3 (GAUZE/BANDAGES/DRESSINGS) IMPLANT
BIT DRILL NEURO 2X3.1 SFT TUCH (MISCELLANEOUS) IMPLANT
BLADE CLIPPER SURG (BLADE) ×3 IMPLANT
BLADE SURG 11 STRL SS (BLADE) ×3 IMPLANT
BNDG GAUZE ELAST 4 BULKY (GAUZE/BANDAGES/DRESSINGS) IMPLANT
BOOT SUTURE AID YELLOW STND (SUTURE) ×3 IMPLANT
CABLE MER (MISCELLANEOUS) ×3 IMPLANT
CANISTER SUCT 3000ML PPV (MISCELLANEOUS) ×3 IMPLANT
CLIP RANEY DISP (INSTRUMENTS) ×3 IMPLANT
CORDS BIPOLAR (ELECTRODE) ×3 IMPLANT
DECANTER SPIKE VIAL GLASS SM (MISCELLANEOUS) ×12 IMPLANT
DRAPE POUCH INSTRU U-SHP 10X18 (DRAPES) ×3 IMPLANT
DRAPE STERI IOBAN 125X83 (DRAPES) IMPLANT
DRILL NEURO 2X3.1 SOFT TOUCH (MISCELLANEOUS)
DRSG OPSITE POSTOP 4X6 (GAUZE/BANDAGES/DRESSINGS) ×3 IMPLANT
DRSG TEGADERM 4X4.75 (GAUZE/BANDAGES/DRESSINGS) ×12 IMPLANT
DRSG TELFA 3X8 NADH (GAUZE/BANDAGES/DRESSINGS) ×3 IMPLANT
DURAPREP 26ML APPLICATOR (WOUND CARE) ×3 IMPLANT
DURAPREP 6ML APPLICATOR 50/CS (WOUND CARE) ×3 IMPLANT
DURASEAL APPLICATOR TIP (TIP) ×3 IMPLANT
DURASEAL SPINE SEALANT 3ML (MISCELLANEOUS) ×3 IMPLANT
ELECT CAUTERY BLADE 6.4 (BLADE) IMPLANT
GAUZE SPONGE 4X4 12PLY STRL (GAUZE/BANDAGES/DRESSINGS) IMPLANT
GAUZE SPONGE 4X4 16PLY XRAY LF (GAUZE/BANDAGES/DRESSINGS) IMPLANT
GLOVE BIO SURGEON STRL SZ8 (GLOVE) ×3 IMPLANT
GLOVE BIOGEL PI IND STRL 7.0 (GLOVE) ×1 IMPLANT
GLOVE BIOGEL PI IND STRL 7.5 (GLOVE) ×2 IMPLANT
GLOVE BIOGEL PI IND STRL 8 (GLOVE) ×1 IMPLANT
GLOVE BIOGEL PI IND STRL 8.5 (GLOVE) ×1 IMPLANT
GLOVE BIOGEL PI INDICATOR 7.0 (GLOVE) ×2
GLOVE BIOGEL PI INDICATOR 7.5 (GLOVE) ×4
GLOVE BIOGEL PI INDICATOR 8 (GLOVE) ×2
GLOVE BIOGEL PI INDICATOR 8.5 (GLOVE) ×2
GLOVE ECLIPSE 8.0 STRL XLNG CF (GLOVE) ×3 IMPLANT
GLOVE EXAM NITRILE LRG STRL (GLOVE) IMPLANT
GLOVE EXAM NITRILE MD LF STRL (GLOVE) IMPLANT
GLOVE EXAM NITRILE XL STR (GLOVE) IMPLANT
GLOVE EXAM NITRILE XS STR PU (GLOVE) IMPLANT
GLOVE SS BIOGEL STRL SZ 7 (GLOVE) ×1 IMPLANT
GLOVE SS N UNI LF 6.5 STRL (GLOVE) ×6 IMPLANT
GLOVE SUPERSENSE BIOGEL SZ 7 (GLOVE) ×2
GLOVE SURG SS PI 7.0 STRL IVOR (GLOVE) ×6 IMPLANT
GOWN STRL REUS W/ TWL LRG LVL3 (GOWN DISPOSABLE) IMPLANT
GOWN STRL REUS W/ TWL XL LVL3 (GOWN DISPOSABLE) ×3 IMPLANT
GOWN STRL REUS W/TWL 2XL LVL3 (GOWN DISPOSABLE) ×3 IMPLANT
GOWN STRL REUS W/TWL LRG LVL3 (GOWN DISPOSABLE)
GOWN STRL REUS W/TWL XL LVL3 (GOWN DISPOSABLE) ×6
HEMOSTAT POWDER KIT SURGIFOAM (HEMOSTASIS) ×3 IMPLANT
KIT ACCESSORY (KITS) ×2
KIT ACCESSORY NEUROSURG SU (KITS) ×1 IMPLANT
KIT BASIN OR (CUSTOM PROCEDURE TRAY) ×3 IMPLANT
KIT HEADREST PASSIVE (Neuro Prosthesis/Implant) ×3 IMPLANT
KIT ROOM TURNOVER OR (KITS) ×3 IMPLANT
LEAD DBS (Neuro Prosthesis/Implant) ×3 IMPLANT
MARKER SKIN DUAL TIP RULER LAB (MISCELLANEOUS) ×3 IMPLANT
NEEDLE HYPO 25X1 1.5 SAFETY (NEEDLE) ×6 IMPLANT
NEEDLE SPNL 18GX3.5 QUINCKE PK (NEEDLE) IMPLANT
NEEDLE SPNL 22GX3.5 QUINCKE BK (NEEDLE) IMPLANT
NS IRRIG 1000ML POUR BTL (IV SOLUTION) ×3 IMPLANT
PACK LAMINECTOMY NEURO (CUSTOM PROCEDURE TRAY) ×3 IMPLANT
PAD ARMBOARD 7.5X6 YLW CONV (MISCELLANEOUS) ×21 IMPLANT
PASSIVE HEADREST KIT ×2 IMPLANT
PERFORATOR LRG  14-11MM (BIT) ×2
PERFORATOR LRG 14-11MM (BIT) ×1 IMPLANT
PLATEFORM ARRAY DZAP LEADPOINT (MISCELLANEOUS) ×2
PLATFORM ARRAY DZAP LEADPOINT (MISCELLANEOUS) ×1 IMPLANT
SET SINGLE IT STRL (KITS) ×3 IMPLANT
SPONGE SURGIFOAM ABS GEL SZ50 (HEMOSTASIS) ×3 IMPLANT
STAPLER SKIN PROX WIDE 3.9 (STAPLE) ×3 IMPLANT
STARFIX SURGICAL KIT ×2 IMPLANT
STIMULOC BUR HOLE COVER ×2 IMPLANT
SUT ETHILON 3 0 PS 1 (SUTURE) ×3 IMPLANT
SUT SILK 2 0 TIES 10X30 (SUTURE) ×3 IMPLANT
SUT VIC AB 2-0 CP2 18 (SUTURE) ×3 IMPLANT
SUTURE REMOVAL KIT ×3 IMPLANT
SYR CONTROL 10ML LL (SYRINGE) ×3 IMPLANT
TOWEL OR 17X24 6PK STRL BLUE (TOWEL DISPOSABLE) ×3 IMPLANT
TOWEL OR 17X26 10 PK STRL BLUE (TOWEL DISPOSABLE) ×3 IMPLANT
TRAY FOLEY W/METER SILVER 14FR (SET/KITS/TRAYS/PACK) IMPLANT
TUBE CONNECTING 12'X1/4 (SUCTIONS)
TUBE CONNECTING 12X1/4 (SUCTIONS) IMPLANT
WATER STERILE IRR 1000ML POUR (IV SOLUTION) ×3 IMPLANT

## 2015-07-31 NOTE — Transfer of Care (Signed)
Immediate Anesthesia Transfer of Care Note  Patient: Logan Griffith  Procedure(s) Performed: Procedure(s) with comments: LEFT DEEP BRAIN STIMULATOR INSERTION (Left) - LEFT DEEP BRAIN STIMULATOR INSERTION  Patient Location: PACU  Anesthesia Type:MAC  Level of Consciousness: awake and alert   Airway & Oxygen Therapy: Patient Spontanous Breathing and Patient connected to nasal cannula oxygen  Post-op Assessment: Report given to RN, Post -op Vital signs reviewed and stable and Patient moving all extremities  Post vital signs: Reviewed and stable  Last Vitals:  Filed Vitals:   07/31/15 0653  BP: 146/71  Pulse: 74  Temp: 36.7 C  Resp: 18    Complications: No apparent anesthesia complications

## 2015-07-31 NOTE — Progress Notes (Signed)
Awake, alert, conversant.  MAEW with good strength.  Doing well.  No complaints of pain.

## 2015-07-31 NOTE — Anesthesia Postprocedure Evaluation (Signed)
Anesthesia Post Note  Patient: Logan Griffith  Procedure(s) Performed: Procedure(s) (LRB): LEFT DEEP BRAIN STIMULATOR INSERTION (Left)  Patient location during evaluation: PACU Anesthesia Type: MAC Level of consciousness: awake and alert Pain management: pain level controlled Vital Signs Assessment: post-procedure vital signs reviewed and stable Respiratory status: spontaneous breathing and nonlabored ventilation Cardiovascular status: blood pressure returned to baseline Anesthetic complications: no    Last Vitals:  Filed Vitals:   07/31/15 1131 07/31/15 1630  BP: 150/68 149/64  Pulse: 58 65  Temp: 36.7 C 36.5 C  Resp: 18 18    Last Pain:  Filed Vitals:   07/31/15 1631  PainSc: 5                  Krystian Younglove COKER

## 2015-07-31 NOTE — Discharge Summary (Signed)
Physician Discharge Summary  Patient ID: Logan Griffith MRN: [id number] DOB/AGE: 76-Nov-1942 76 y.o.  Admit date: 07/31/2015 Discharge date: 07/31/2015  Admission Diagnoses: Tremor  Discharge Diagnoses: Tremor Active Problems:   Tremor   Discharged Condition: good  Hospital Course: Patient underwent Deep Brain Stimulator Electrode placement Left VIM Thalamus.  He did well with surgery and was discharged home on POD 1.  Consults: None  Significant Diagnostic Studies: None  Treatments: surgery: Deep Brain Stimulator Electrode placement Left VIM Thalamus  Discharge Exam: Blood pressure 149/64, pulse 65, temperature 97.7 F (36.5 C), temperature source Oral, resp. rate 18, height 6\' 1"  (1.854 m), weight 122.613 kg (270 lb 5 oz), SpO2 97 %. Neurologic: Alert and oriented X 3, normal strength and tone. Normal symmetric reflexes. Normal coordination and gait Wound:CDI  Disposition: Home     Medication List    TAKE these medications        amLODipine 10 MG tablet  Commonly known as:  NORVASC  Take 10 mg by mouth daily.     aspirin 81 MG tablet  Take 81 mg by mouth daily.     atorvastatin 40 MG tablet  Commonly known as:  LIPITOR  Take 40 mg by mouth daily.     beta carotene w/minerals tablet  Take 1 tablet by mouth daily.     cephALEXin 500 MG capsule  Commonly known as:  KEFLEX  Take 500 mg by mouth 3 (three) times daily.     CINNAMON PO  Take by mouth.     clotrimazole 1 % cream  Commonly known as:  LOTRIMIN  Apply 1 application topically 2 (two) times daily.     finasteride 5 MG tablet  Commonly known as:  PROSCAR  Take 5 mg by mouth daily.     Flaxseed Oil 1000 MG Caps  Take 1,000 mg by mouth daily.     gabapentin 600 MG tablet  Commonly known as:  NEURONTIN  Take 600 mg by mouth 2 (two) times daily.     GARLIC PO  Take by mouth.     gemfibrozil 600 MG tablet  Commonly known as:  LOPID  Take 600 mg by mouth 2 (two) times daily before a meal.     glimepiride 4 MG tablet  Commonly known as:  AMARYL  Take 4 mg by mouth daily with breakfast.     HYDROcodone-acetaminophen 5-325 MG tablet  Commonly known as:  NORCO/VICODIN  Take 1 tablet by mouth.     HYDROcodone-acetaminophen 5-325 MG tablet  Commonly known as:  NORCO/VICODIN  Take 1-2 tablets by mouth every 4 (four) hours as needed (mild pain).     JARDIANCE 25 MG Tabs tablet  Generic drug:  empagliflozin  Take 25 mg by mouth daily.     labetalol 300 MG tablet  Commonly known as:  NORMODYNE  Take 300 mg by mouth 2 (two) times daily.     losartan-hydrochlorothiazide 100-25 MG tablet  Commonly known as:  HYZAAR  Take 1 tablet by mouth daily.     multivitamin tablet  Take 1 tablet by mouth daily.     oxybutynin 5 MG tablet  Commonly known as:  DITROPAN  Take 5 mg by mouth 3 (three) times daily.     pimecrolimus 1 % cream  Commonly known as:  ELIDEL  Apply 1 application topically daily as needed (for psoriasis).     Potassium 99 MG Tabs  Take 99 mg by mouth daily.     primidone 250  MG tablet  Commonly known as:  MYSOLINE  Take 250 mg by mouth at bedtime.     PROBIOTIC PO  Take by mouth.     sertraline 100 MG tablet  Commonly known as:  ZOLOFT  Take 100 mg by mouth daily.     tamsulosin 0.4 MG Caps capsule  Commonly known as:  FLOMAX  Take 0.4 mg by mouth.     TOUJEO SOLOSTAR 300 UNIT/ML Sopn  Generic drug:  Insulin Glargine  Inject 55 Units into the skin at bedtime.     TRADJENTA 5 MG Tabs tablet  Generic drug:  linagliptin  Take 5 mg by mouth daily.     vitamin B-12 1000 MCG tablet  Commonly known as:  CYANOCOBALAMIN  Take 1,000 mcg by mouth daily.     Vitamin D3 3000 units Tabs  Take 3,000 Units by mouth daily.         Signed: Peggyann Shoals, MD 07/31/2015, 4:48 PM

## 2015-07-31 NOTE — Interval H&P Note (Signed)
History and Physical Interval Note:  07/31/2015 6:28 AM  Logan Griffith  has presented today for surgery, with the diagnosis of essential tremor  The various methods of treatment have been discussed with the patient and family. After consideration of risks, benefits and other options for treatment, the patient has consented to  Procedure(s) with comments: LEFT DEEP BRAIN STIMULATOR INSERTION (Left) - LEFT DEEP BRAIN STIMULATOR INSERTION as a surgical intervention .  The patient's history has been reviewed, patient examined, no change in status, stable for surgery.  I have reviewed the patient's chart and labs.  Questions were answered to the patient's satisfaction.     Shukri Nistler D

## 2015-07-31 NOTE — Evaluation (Signed)
Physical Therapy Evaluation Patient Details Name: Logan Griffith MRN: [id number] DOB: 08-25-1940 Today's Date: 07/31/2015   History of Present Illness  Pt is a 75 y.o male with a PMH significant for essential tremor. Pt presents s/p L VIM thalamus deep brain stimulator electrode placement on 07/31/15.  Clinical Impression  Pt admitted with above diagnosis. Pt currently with functional limitations due to the deficits listed below (see PT Problem List). At the time of PT eval pt was able to perform transfers and ambulation with close guard for safety, but no real assist was required. Pt has a cane in the room that he uses "occasionally", however declined to use it today. Pt will benefit from skilled PT to increase their independence and safety with mobility to allow discharge to the venue listed below.       Follow Up Recommendations No PT follow up;Supervision for mobility/OOB    Equipment Recommendations  None recommended by PT    Recommendations for Other Services       Precautions / Restrictions Precautions Precautions: Fall Restrictions Weight Bearing Restrictions: No      Mobility  Bed Mobility Overal bed mobility: Needs Assistance Bed Mobility: Rolling;Sidelying to Sit Rolling: Supervision Sidelying to sit: Min guard       General bed mobility comments: VC's for log roll technique for comfort.   Transfers Overall transfer level: Needs assistance Equipment used: None Transfers: Sit to/from Stand Sit to Stand: Min guard         General transfer comment: Close guard for safety. No unsteadiness or LOB noted.  Ambulation/Gait Ambulation/Gait assistance: Min guard Ambulation Distance (Feet): 100 Feet Assistive device: None Gait Pattern/deviations: Step-through pattern;Decreased stride length;Trendelenburg;Wide base of support Gait velocity: Decreased Gait velocity interpretation: Below normal speed for age/gender General Gait Details: PT carried pt's SPC but pt  declined using it. Noted trendelenberg gait pattern but no real LOB.   Stairs            Wheelchair Mobility    Modified Rankin (Stroke Patients Only)       Balance Overall balance assessment: Needs assistance Sitting-balance support: Feet supported;No upper extremity supported Sitting balance-Leahy Scale: Fair     Standing balance support: No upper extremity supported;During functional activity Standing balance-Leahy Scale: Fair                               Pertinent Vitals/Pain Pain Assessment: Faces Faces Pain Scale: Hurts little more Pain Descriptors / Indicators: Headache Pain Intervention(s): Limited activity within patient's tolerance;Monitored during session;Repositioned    Home Living Family/patient expects to be discharged to:: Private residence Living Arrangements: Spouse/significant other Available Help at Discharge: Family;Available 24 hours/day Type of Home: House Home Access: Stairs to enter   [employer] of Steps: 1-2 Home Layout: Two level;Laundry or work area in [place]: [name] - single point      Prior Function Level of Independence: Independent with assistive device(s)               Hand Dominance        Extremity/Trunk Assessment   Upper Extremity Assessment: Defer to OT evaluation           Lower Extremity Assessment: Generalized weakness      Cervical / Trunk Assessment: Normal  Communication   Communication: No difficulties  Cognition Arousal/Alertness: Awake/alert Behavior During Therapy: WFL for tasks assessed/performed Overall Cognitive Status: Within Functional Limits for tasks assessed  General Comments      Exercises        Assessment/Plan    PT Assessment Patient needs continued PT services  PT Diagnosis Difficulty walking;Acute pain   PT Problem List Decreased strength;Decreased range of motion;Decreased activity  tolerance;Decreased balance;Decreased mobility;Decreased knowledge of use of DME;Decreased safety awareness;Decreased knowledge of precautions;Pain  PT Treatment Interventions DME instruction;Stair training;Gait training;Functional mobility training;Therapeutic activities;Therapeutic exercise;Neuromuscular re-education;Patient/family education   PT Goals (Current goals can be found in the Care Plan section) Acute Rehab PT Goals Patient Stated Goal: Home tomorrow PT Goal Formulation: With patient Time For Goal Achievement: 08/07/15 Potential to Achieve Goals: Good    Frequency Min 4X/week   Barriers to discharge        Co-evaluation               End of Session Equipment Utilized During Treatment: Gait belt Activity Tolerance: Patient tolerated treatment well Patient left: in chair;with call bell/phone within reach Nurse Communication: Mobility status         Time: [id number] PT Time Calculation (min) (ACUTE ONLY): 12 min   Charges:   PT Evaluation $PT Eval Moderate Complexity: 1 Procedure     PT G Codes:        Rolinda Roan 2015-08-10, 2:48 PM   Rolinda Roan, PT, DPT Acute Rehabilitation Services Pager: [phone]

## 2015-07-31 NOTE — Anesthesia Preprocedure Evaluation (Addendum)
Anesthesia Evaluation  Patient identified by MRN, date of birth, ID band Patient awake    Reviewed: Allergy & Precautions, NPO status , Patient's Chart, lab work & pertinent test results  Airway Mallampati: II  TM Distance: <3 FB Neck ROM: Full    Dental  (+) Teeth Intact, Dental Advisory Given, Missing, Implants   Pulmonary sleep apnea , former smoker,    breath sounds clear to auscultation       Cardiovascular hypertension,  Rhythm:Regular Rate:Normal     Neuro/Psych    GI/Hepatic   Endo/Other  diabetes, Insulin Dependent  Renal/GU      Musculoskeletal   Abdominal   Peds  Hematology   Anesthesia Other Findings   Reproductive/Obstetrics                            Anesthesia Physical Anesthesia Plan  ASA: III  Anesthesia Plan: MAC   Post-op Pain Management:    Induction: Intravenous  Airway Management Planned: Natural Airway and Simple Face Mask  Additional Equipment:   Intra-op Plan:   Post-operative Plan:   Informed Consent: I have reviewed the patients History and Physical, chart, labs and discussed the procedure including the risks, benefits and alternatives for the proposed anesthesia with the patient or authorized representative who has indicated his/her understanding and acceptance.   Dental advisory given  Plan Discussed with: CRNA and Anesthesiologist  Anesthesia Plan Comments:         Anesthesia Quick Evaluation

## 2015-07-31 NOTE — Anesthesia Procedure Notes (Signed)
Procedure Name: MAC Date/Time: 07/31/2015 7:40 AM Performed by: Suzy Bouchard Pre-anesthesia Checklist: Patient identified, Timeout performed, Emergency Drugs available, Suction available and Patient being monitored Patient Re-evaluated:Patient Re-evaluated prior to inductionOxygen Delivery Method: Nasal cannula

## 2015-07-31 NOTE — Progress Notes (Signed)
Patient ID: Logan Griffith, male   DOB: 12-Aug-1940, 75 y.o.   MRN: [id number]  Alert, conversant. MAEW. PEARL. Speech fluent, clear.Reports only mild h/a. Sites without bleeding or swelling. Drsgs intact. Will observe overnight with plan to d/c home in the morning. Pt verbalizes understanding of d/c instructions & agrees to call with any questions or concerns.   Verdis Prime RN BSN

## 2015-07-31 NOTE — Care Management (Signed)
Utilization review completed. Ahan Eisenberger, RN Case Manager [phone]. 

## 2015-07-31 NOTE — H&P (View-Only) (Signed)
Patient ID:   [id number] Patient: Logan Griffith  Date of Birth: Oct 26, 1940 Visit Type: Office Visit   Date: 05/18/2015 02:30 PM Provider: Marchia Meiers. Vertell Limber MD   This 75 year old male presents for Tremor.  History of Present Illness: 1.  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  Patient recalls five years symptoms right greater than left hand tremor.  History: IDDM, HTN, essential tremor Surgical history: Left ankle 3, left hand, circumcision 2014  MRI on [place]  The patient complains of right greater than left hand tremor.  He says it is very difficult for him to do his activities of daily living and things such as eating are becoming increasingly difficult for him.  He says he cannot poor water and when he attempted to do so he poured it all over himself and Dr. Carles Collet.  The patient has had a fusion of his left ankle.  He denies problems with rigidity.  He was not felt to have any signs of parkinsonism.  Dr. Carles Collet has recommended left VIM thalamus DBS for predominantly right-sided tremor.  She has recommended a unilateral surgery.        PAST MEDICAL/SURGICAL HISTORY   (Detailed)  Disease/disorder Onset Date Management Date Comments    Ankle surgery (3)      Hand surgery      Circumsion 2014   Arthritis      Depression      Diabetes mellitus      High cholesterol      Hypertension         PAST MEDICAL HISTORY, SURGICAL HISTORY, FAMILY HISTORY, SOCIAL HISTORY AND REVIEW OF SYSTEMS I have reviewed the patient's past medical, surgical, family and social history as well as the comprehensive review of systems as included on the [facility] history form dated 05/18/2015, which I have signed.  Family History  (Detailed) Relationship Family Member Name Deceased Age at Death Condition Onset Age Cause of Death      Family history of Diabetes mellitus  N      Family  history of Hypertension  N    SOCIAL HISTORY  (Detailed) Tobacco use reviewed. Preferred language is Unknown.   Smoking status: Never smoker.  SMOKING STATUS Use Status Type Smoking Status Usage Per Day Years Used Total Pack Years  no/never  Never smoker             MEDICATIONS(added, continued or stopped this visit): Started Medication Directions Instruction Stopped   amlodipine 10 mg tablet take 1 tablet by oral route  every day     aspirin 81 mg chewable tablet chew 1 tablet by oral route  every day     Centrum Silver Take once daily     Cinnamon 500 mg capsule Take as directed     flaxseed oil 1,000 mg capsule Take once daily     gabapentin 600 mg tablet take 1 tablet by oral route 2 times every day     garlic Take as directed     gemfibrozil 600 mg tablet Take as directed     glimepiride 4 mg tablet Take as directed     hydrocodone-acetaminophen take 1 - 2 tablets by oral route  every 6 hours as needed for pain     Jardiance 25 mg tablet Take as directed     labetalol 300 mg tablet take 1 tablet by oral route 2 times every day  Lipitor 40 mg tablet Take as directed     losartan 100 mg-hydrochlorothiazide 25 mg tablet take 1 tablet by oral route  every day     MSM 1,000 mg tablet Take as directed     Ocuvite Lutein and Zeaxanthin Take once daily     oxybutynin chloride 5 mg tablet Take as directed     potassium 99 mg tablet Take as directed     primidone 250 mg tablet Take as directed     probiotic Take as directed     sertraline 100 mg tablet Take as directed     tamsulosin 0.4 mg capsule Take as directed     Toujeo SoloStar 300 unit/mL (1.5 mL) subcutaneous insulin pen Use as directed     Tradjenta 5 mg tablet Take as directed     Vitamin B-12 Take as directed     Vitamin D3 Take as directed       ALLERGIES: Ingredient Reaction Medication Name Comment  NO KNOWN ALLERGIES     No known allergies.    Vitals Date Temp F BP Pulse Ht In Wt Lb BMI BSA Pain  Score  05/18/2015  160/72 85 73 274 36.15  0/10     PHYSICAL EXAM General Level of Distress: no acute distress Overall Appearance: normal  Head and Face  Right Left  Fundoscopic Exam:  normal normal    Cardiovascular Cardiac: regular rate and rhythm without murmur  Right Left  Carotid Pulses: normal normal  Respiratory Lungs: clear to auscultation  Neurological Orientation: normal Recent and Remote Memory: normal Attention Span and Concentration:   normal Language: normal Fund of Knowledge: normal  Right Left Sensation: normal normal Upper Extremity Coordination: normal normal  Lower Extremity Coordination: normal normal  Musculoskeletal Gait and Station: normal  Right Left Upper Extremity Muscle Strength: normal normal Lower Extremity Muscle Strength: normal normal Upper Extremity Muscle Tone:  tremor tremor Lower Extremity Muscle Tone: normal normal  Motor Strength Upper and lower extremity motor strength was tested in the clinically pertinent muscles.     Deep Tendon Reflexes  Right Left Biceps: normal normal Triceps: normal normal Brachiloradialis: normal normal Patellar: normal normal Achilles: normal normal  Cranial Nerves II. Optic Nerve/Visual Fields: normal III. Oculomotor: normal IV. Trochlear: normal V. Trigeminal: normal VI. Abducens: normal VII. Facial: normal VIII. Acoustic/Vestibular: normal IX. Glossopharyngeal: normal X. Vagus: normal XI. Spinal Accessory: normal XII. Hypoglossal: normal  Motor and other Tests Lhermittes: negative Rhomberg: negative Pronator drift: absent     Right Left Hoffman's: normal normal Clonus: normal normal Babinski: normal normal   Additional Findings:  The patient has intention tremor right hand greater than left.  He has no rest tremor.  He has no rigidity.  He has some mild titubation of his head.    IMPRESSION The patient has right greater than left-sided tremor.  He is having  difficulty using his right hand for activities such as eating and writing.  Dr. Carles Collet has recommended proceeding with unilateral DBS.  I went over the specifics of this surgery in detail with the patient and his family.  I answered their questions.  We discussed the use of Star fix and the operation was to place the electrode and then the subsequent operation to place the implantable pulse generator.  Completed Orders (this encounter) Order Details Reason Side Interpretation Result Initial Treatment Date Region  Hypertension education Follow up with primary care physician.        Lifestyle education regarding diet Encouraged  to eat a well balanced diet and follow up with primary care physician.         Assessment/Plan # Detail Type Description   1. Assessment Essential tremor (G25.0).       2. Assessment Essential (primary) hypertension (I10).       3. Assessment Body mass index (BMI) 36.0-36.9, adult (Z68.36).   Plan Orders Today's instructions / counseling include(s) Lifestyle education regarding diet.         Pain Assessment/Treatment Pain Scale: 0/10. Method: Numeric Pain Intensity Scale.  The patient wishes to proceed with DBS surgery.  This will be scheduled after February 11 because of family scheduling concerns.  He wants his daughter to be present and therefore wants to wait until after she is available to come at that time.  The risks and benefits were discussed in detail with the patient and his family and they wished to proceed.  Orders: Instruction(s)/Education: Assessment Instruction  I10 Hypertension education  [phone] Lifestyle education regarding diet             Provider:  Marchia Meiers. Vertell Limber MD  05/23/2015 03:19 PM Dictation edited by: Marchia Meiers. Vertell Limber    CC Providers: Wells Guiles Tat [address] [place], [place] [id number]              Electronically signed by Marchia Meiers Vertell Limber MD on 05/23/2015 03:19 PM

## 2015-07-31 NOTE — Discharge Summary (Signed)
Physician Discharge Summary  Patient ID: Logan Griffith MRN: [id number] DOB/AGE: 1940-10-09 75 y.o.  Admit date: 07/31/2015 Discharge date: 07/31/2015  Admission Diagnoses: essential tremor   Discharge Diagnoses: essential tremor s/p LEFT DEEP BRAIN STIMULATOR INSERTION (Left) - LEFT DEEP BRAIN STIMULATOR INSERTION with Star Fix and microelectrode recording  Active Problems:   Tremor   Discharged Condition: good  Hospital Course: Logan Griffith was admitted for placement of left deep brain stimulator for essential tremor.  Following uncomplicated surgery, he recovered nicely and transferred to [facility] for nursing observation.   Consults: None  Significant Diagnostic Studies:   Treatments: surgery: LEFT DEEP BRAIN STIMULATOR INSERTION (Left) - LEFT DEEP BRAIN STIMULATOR INSERTION with Star Fix and microelectrode recording   Discharge Exam: Blood pressure 149/64, pulse 65, temperature 97.7 F (36.5 C), temperature source Oral, resp. rate 18, height 6\' 1"  (1.854 m), weight 122.613 kg (270 lb 5 oz), SpO2 97 %. Alert, conversant. MAEW. PEARL. Speech fluent, clear.Reports only mild h/a. Sites without bleeding or swelling. Drsgs intact. Will observe overnight with plan to d/c home in the morning. Pt verbalizes understanding of d/c instructions & agrees to call with any questions or concerns.    Disposition: 01-Home or Self Care     Medication List    TAKE these medications        amLODipine 10 MG tablet  Commonly known as:  NORVASC  Take 10 mg by mouth daily.     aspirin 81 MG tablet  Take 81 mg by mouth daily.     atorvastatin 40 MG tablet  Commonly known as:  LIPITOR  Take 40 mg by mouth daily.     beta carotene w/minerals tablet  Take 1 tablet by mouth daily.     cephALEXin 500 MG capsule  Commonly known as:  KEFLEX  Take 500 mg by mouth 3 (three) times daily.     CINNAMON PO  Take by mouth.     clotrimazole 1 % cream  Commonly known as:  LOTRIMIN  Apply 1 application  topically 2 (two) times daily.     finasteride 5 MG tablet  Commonly known as:  PROSCAR  Take 5 mg by mouth daily.     Flaxseed Oil 1000 MG Caps  Take 1,000 mg by mouth daily.     gabapentin 600 MG tablet  Commonly known as:  NEURONTIN  Take 600 mg by mouth 2 (two) times daily.     GARLIC PO  Take by mouth.     gemfibrozil 600 MG tablet  Commonly known as:  LOPID  Take 600 mg by mouth 2 (two) times daily before a meal.     glimepiride 4 MG tablet  Commonly known as:  AMARYL  Take 4 mg by mouth daily with breakfast.     HYDROcodone-acetaminophen 5-325 MG tablet  Commonly known as:  NORCO/VICODIN  Take 1 tablet by mouth.     HYDROcodone-acetaminophen 5-325 MG tablet  Commonly known as:  NORCO/VICODIN  Take 1-2 tablets by mouth every 4 (four) hours as needed (mild pain).     JARDIANCE 25 MG Tabs tablet  Generic drug:  empagliflozin  Take 25 mg by mouth daily.     labetalol 300 MG tablet  Commonly known as:  NORMODYNE  Take 300 mg by mouth 2 (two) times daily.     losartan-hydrochlorothiazide 100-25 MG tablet  Commonly known as:  HYZAAR  Take 1 tablet by mouth daily.     multivitamin tablet  Take 1 tablet by  mouth daily.     oxybutynin 5 MG tablet  Commonly known as:  DITROPAN  Take 5 mg by mouth 3 (three) times daily.     pimecrolimus 1 % cream  Commonly known as:  ELIDEL  Apply 1 application topically daily as needed (for psoriasis).     Potassium 99 MG Tabs  Take 99 mg by mouth daily.     primidone 250 MG tablet  Commonly known as:  MYSOLINE  Take 250 mg by mouth at bedtime.     PROBIOTIC PO  Take by mouth.     sertraline 100 MG tablet  Commonly known as:  ZOLOFT  Take 100 mg by mouth daily.     tamsulosin 0.4 MG Caps capsule  Commonly known as:  FLOMAX  Take 0.4 mg by mouth.     TOUJEO SOLOSTAR 300 UNIT/ML Sopn  Generic drug:  Insulin Glargine  Inject 55 Units into the skin at bedtime.     TRADJENTA 5 MG Tabs tablet  Generic drug:   linagliptin  Take 5 mg by mouth daily.     vitamin B-12 1000 MCG tablet  Commonly known as:  CYANOCOBALAMIN  Take 1,000 mcg by mouth daily.     Vitamin D3 3000 units Tabs  Take 3,000 Units by mouth daily.         Signed: Verdis Prime 07/31/2015, 4:46 PM

## 2015-07-31 NOTE — Progress Notes (Signed)
PHARMACIST - PHYSICIAN ORDER COMMUNICATION  CONCERNING: P&T Medication Policy on Herbal Medications  DESCRIPTION:  This patient's order for:  Flaxseed Oil capsules  has been noted.  This product(s) is classified as an "herbal" or natural product. Due to a lack of definitive safety studies or FDA approval, nonstandard manufacturing practices, plus the potential risk of unknown drug-drug interactions while on inpatient medications, the Pharmacy and Therapeutics Committee does not permit the use of "herbal" or natural products of this type within [facility].   ACTION TAKEN: The pharmacy department is unable to verify this order at this time and your patient has been informed of this safety policy. Please reevaluate patient's clinical condition at discharge and address if the herbal or natural product(s) should be resumed at that time.   Reatha Harps, Pharm.D., BCPS Clinical Pharmacist

## 2015-07-31 NOTE — Brief Op Note (Signed)
07/31/2015  10:54 AM  PATIENT:  Logan Griffith  75 y.o. male  PRE-OPERATIVE DIAGNOSIS:  essential tremor  POST-OPERATIVE DIAGNOSIS:  essential tremor  PROCEDURE:  Procedure(s) with comments: LEFT DEEP BRAIN STIMULATOR INSERTION (Left) - LEFT DEEP BRAIN STIMULATOR INSERTION with Star Fix and microelectrode recording  SURGEON:  Surgeon(s) and Role:    * Erline Levine, MD - Primary    * Eustace Quail Tat, DO - Assisting    * Kevan Ny Ditty, MD - Assisting  PHYSICIAN ASSISTANT:   ASSISTANTS: Poteat, RN   ANESTHESIA:   local and IV sedation  EBL:   Min  BLOOD ADMINISTERED:none  DRAINS: none   LOCAL MEDICATIONS USED:  MARCAINE    and LIDOCAINE   SPECIMEN:  No Specimen  DISPOSITION OF SPECIMEN:  N/A  COUNTS:  YES  TOURNIQUET:  * No tourniquets in log *  DICTATION: Indications: Patient is a 75 year old man with Essential Tremor it was elected to take him to surgery for left VIM Thalamus deep brain stimulator electrode placement  Procedure: Preoperative planing was performed with volumetricCT and placement of 4 fiducial markers in the skull followed by CT of the brain also obtained volumetrically. These were then exported to create Starfix head frame with planned targeting of VIM nucleus electrodes was performed. The patient was brought to the operating room and placed in a semi-Fowler's position with her neck and stabilized in a neck holder. This was affixed to the Mayfield adapter. His scalp was then prepped with DuraPrep and subsequently draped with an Ioban drape.with the fiducials and the skin was infiltrated with local lidocaine.  The areas of planned incision were then infiltrated with local lidocaine with epinephrine. The stepoffs were connected and the Starfix frame was assembled.  The entry points were then marked.  A curvilinear incision was made centered on the left entry point. An elevator was used to clear pericranium from the skull. The perforator it was then used to  produce a 14 mm bur hole. The dura was coagulated with bipolar electrocautery.  After opening the dura and a localizing the entry point the stylette and outer cannula were inserted into the brain. Surgifoam was placed to prevent CSF leakage at the entry site. Microelectrode recordings were then performed and  we had very good recordings from the VIM. Subsequently the stimulating electrode was placed and the patient had significant improvement in tremor on the right side of the body without significant side effects to higher voltages. The electrode was then locked into position with the stimlock cap. The redundant electrode was circularized and tunneled under the scalp. The electrode was tunneled to the left and then into the posterior scalp and locked into position. The wounds were then irrigated and closed with 2-0 Vicryl sutures and staples. The fiducials were removed and staples were placed over each of these sites. The head was washed and then sterile occlusive dressings were placed. The patient was taken to recovery having tolerated her procedure without difficulty or untoward effect. Please refer to detailed microelectrode recordings from Dr. Carles Collet for more specifics of the positioning of the electrodes. These are included in her Epic note from the detailed neural monitoring and physical exam assessment during the surgery.   PLAN OF CARE: Admit to inpatient   PATIENT DISPOSITION:  PACU - hemodynamically stable.   Delay start of Pharmacological VTE agent (>24hrs) due to surgical blood loss or risk of bleeding: yes

## 2015-07-31 NOTE — Op Note (Signed)
07/31/2015  10:54 AM  PATIENT:  Logan Griffith  75 y.o. male  PRE-OPERATIVE DIAGNOSIS:  essential tremor  POST-OPERATIVE DIAGNOSIS:  essential tremor  PROCEDURE:  Procedure(s) with comments: LEFT DEEP BRAIN STIMULATOR INSERTION (Left) - LEFT DEEP BRAIN STIMULATOR INSERTION with Star Fix and microelectrode recording  SURGEON:  Surgeon(s) and Role:    * Erline Levine, MD - Primary    * Eustace Quail Tat, DO - Assisting    * Kevan Ny Ditty, MD - Assisting  PHYSICIAN ASSISTANT:   ASSISTANTS: Poteat, RN   ANESTHESIA:   local and IV sedation  EBL:   Min  BLOOD ADMINISTERED:none  DRAINS: none   LOCAL MEDICATIONS USED:  MARCAINE    and LIDOCAINE   SPECIMEN:  No Specimen  DISPOSITION OF SPECIMEN:  N/A  COUNTS:  YES  TOURNIQUET:  * No tourniquets in log *  DICTATION: Indications: Patient is a 75 year old man with Essential Tremor it was elected to take him to surgery for left VIM Thalamus deep brain stimulator electrode placement  Procedure: Preoperative planing was performed with volumetricCT and placement of 4 fiducial markers in the skull followed by CT of the brain also obtained volumetrically. These were then exported to create Starfix head frame with planned targeting of VIM nucleus electrodes was performed. The patient was brought to the operating room and placed in a semi-Fowler's position with her neck and stabilized in a neck holder. This was affixed to the Mayfield adapter. His scalp was then prepped with DuraPrep and subsequently draped with an Ioban drape.with the fiducials and the skin was infiltrated with local lidocaine.  The areas of planned incision were then infiltrated with local lidocaine with epinephrine. The stepoffs were connected and the Starfix frame was assembled.  The entry points were then marked.  A curvilinear incision was made centered on the left entry point. An elevator was used to clear pericranium from the skull. The perforator it was then used to  produce a 14 mm bur hole. The dura was coagulated with bipolar electrocautery.  After opening the dura and a localizing the entry point the stylette and outer cannula were inserted into the brain. Surgifoam was placed to prevent CSF leakage at the entry site. Microelectrode recordings were then performed and  we had very good recordings from the VIM. Subsequently the stimulating electrode was placed and the patient had significant improvement in tremor on the right side of the body without significant side effects to higher voltages. The electrode was then locked into position with the stimlock cap. The redundant electrode was circularized and tunneled under the scalp. The electrode was tunneled to the left and then into the posterior scalp and locked into position. The wounds were then irrigated and closed with 2-0 Vicryl sutures and staples. The fiducials were removed and staples were placed over each of these sites. The head was washed and then sterile occlusive dressings were placed. The patient was taken to recovery having tolerated her procedure without difficulty or untoward effect. Please refer to detailed microelectrode recordings from Dr. Carles Collet for more specifics of the positioning of the electrodes. These are included in her Epic note from the detailed neural monitoring and physical exam assessment during the surgery.   PLAN OF CARE: Admit to inpatient   PATIENT DISPOSITION:  PACU - hemodynamically stable.   Delay start of Pharmacological VTE agent (>24hrs) due to surgical blood loss or risk of bleeding: yes

## 2015-07-31 NOTE — Procedures (Signed)
Preoperative diagnosis:  Essential Tremor Postoperative diagnosis:  Essential Tremor Surgeon:  Erline Levine Neurologist:  Wells Guiles Tat  CPT codes: [phone] (first hour) [phone] (for each additional hour) Indication for procedure: Determination of optimal electrode position for deep brain stimulation therapy. Complications: None   Description of procedure:  Following the incision and burr hole placement, a recording microelectrode was slowly advanced into the brain via a motorized drive.  Beginning approximately 10 mm above the target, recordings were periodically made, and the resultant brain activity was described on the neurophysiologic recording worksheet.    A total of one recording pass was obtained on the left side of the brain.    5-6 mm of VIM was recorded from the left side of the brain.  Following the recording, the recording electrode was replaced by a stimulating electrode by Dr. Vertell Limber.  Test stimulations were then obtained.  Active contacts that were used and the response to stimulation, including both adverse effects and therapeutic effects, were recorded on the neurophysiologic stimulation worksheet.  In brief, there was complete resolution of tremor following activation and stimulation.  Adverse effects were not noted and the patient was stimulated up to 6.0 volts.   Alonza Bogus, DO [facility] Director of Movement Disorders

## 2015-08-01 LAB — GLUCOSE, CAPILLARY: GLUCOSE-CAPILLARY: 132 mg/dL — AB (ref 65–99)

## 2015-08-01 NOTE — Progress Notes (Signed)
Pt doing well. Pt given D/C instructions with Rx, verbal understanding was provided. Pt's incisions are covered with Honeycomb dressing and has minimal amount of drainage. Pt's IV was removed prior to D/C. Pt D/C'd home via wheelchair @ 0930 per MD order. Pt is stable @ D/C and has no other needs at this time. Holli Humbles, RN

## 2015-08-01 NOTE — Progress Notes (Signed)
Physical Therapy Treatment Patient Details Name: Logan Griffith MRN: [id number] DOB: 10-13-1940 Today's Date: 08/01/2015    History of Present Illness Pt is a 75 y.o male with a PMH significant for essential tremor. Pt presents s/p L VIM thalamus deep brain stimulator electrode placement on 07/31/15.    PT Comments    Patient progressing well towards PT goals. Ambulated increased distance without device (4 standing rest breaks) and performed stair negotiation with use of rails. Educated on safety with mobility upon acute discharge.   Follow Up Recommendations  No PT follow up;Supervision for mobility/OOB     Equipment Recommendations  None recommended by PT    Recommendations for Other Services       Precautions / Restrictions Precautions Precautions: Fall Restrictions Weight Bearing Restrictions: No    Mobility  Bed Mobility               General bed mobility comments: Patient sitting in chair upon arrival  Transfers Overall transfer level: Needs assistance Equipment used: None Transfers: Sit to/from Stand Sit to Stand: Supervision         General transfer comment: Supervision for safety. No LOB noted.   Ambulation/Gait Ambulation/Gait assistance: Supervision Ambulation Distance (Feet): 310 Feet Assistive device: None Gait Pattern/deviations: Step-through pattern;Decreased stride length;Trendelenburg;Wide base of support Gait velocity: decreased Gait velocity interpretation: Below normal speed for age/gender General Gait Details: increased lateral sway, trendelenberg gait pattern, 4 standing rest breaks   Stairs Stairs: Yes Stairs assistance: Min guard Stair Management: Two rails;Step to pattern;Forwards Number of Stairs: 6 General stair comments: min guard for safety, no physical assist required  Wheelchair Mobility    Modified Rankin (Stroke Patients Only)       Balance Overall balance assessment: Needs assistance Sitting-balance support:  No upper extremity supported;Feet supported Sitting balance-Leahy Scale: Fair     Standing balance support: No upper extremity supported;During functional activity Standing balance-Leahy Scale: Fair                      Cognition Arousal/Alertness: Awake/alert Behavior During Therapy: WFL for tasks assessed/performed Overall Cognitive Status: Within Functional Limits for tasks assessed                      Exercises      General Comments General comments (skin integrity, edema, etc.): educated on safety with mobility upon discharge.       Pertinent Vitals/Pain Pain Assessment: 0-10 Pain Score: 5  Pain Location: head Pain Descriptors / Indicators: Aching;Headache Pain Intervention(s): Monitored during session;Premedicated before session;Repositioned    Home Living Family/patient expects to be discharged to:: Private residence Living Arrangements: Spouse/significant other Available Help at Discharge: Family;Available 24 hours/day Type of Home: House Home Access: Stairs to enter   Home Layout: Two level;Laundry or work area in basement;Able to live on main level with bedroom/bathroom Home Equipment: [name] - single point      Prior Function Level of Independence: Independent with assistive device(s)          PT Goals (current goals can now be found in the care plan section) Acute Rehab PT Goals Patient Stated Goal: home today PT Goal Formulation: With patient Time For Goal Achievement: 08/07/15 Potential to Achieve Goals: Good Progress towards PT goals: Progressing toward goals    Frequency  Min 4X/week    PT Plan Current plan remains appropriate    Co-evaluation             End of Session  Equipment Utilized During Treatment: Gait belt Activity Tolerance: Patient tolerated treatment well Patient left: in chair;with call bell/phone within reach     Time: 0808-0823 PT Time Calculation (min) (ACUTE ONLY): 15 min  Charges:  $Gait  Training: 8-22 mins                    G CodesDuncan Dull 08/29/2015, 8:27 AM Alben Deeds, PT DPT  [phone]

## 2015-08-01 NOTE — Evaluation (Signed)
Occupational Therapy Evaluation Patient Details Name: Logan Griffith MRN: [id number] DOB: [birth year] Today's Date: 08/01/2015    History of Present Illness Pt is a 75 y.o male with a PMH significant for essential tremor. Pt presents s/p L VIM thalamus deep brain stimulator electrode placement on 07/31/15.   Clinical Impression   Pt reports he was independent with ADLs PTA. Currently pt is overall supervision for safety with ADLs and functional mobility with the exception of min assist for LB dressing. All education complete; pt verbalized understanding and has no further questions or concerns for OT at this time. Pt planning to d/c home with 24/7 supervision from his wife. No further acute OT needs identified; signing off at this time. Thank you for this referral.     Follow Up Recommendations  No OT follow up;Supervision/Assistance - 24 hour    Equipment Recommendations  None recommended by OT    Recommendations for Other Services       Precautions / Restrictions Precautions Precautions: Fall Restrictions Weight Bearing Restrictions: No      Mobility Bed Mobility               General bed mobility comments: Pt sitting EOB upon arrival.  Transfers Overall transfer level: Needs assistance Equipment used: None Transfers: Sit to/from Stand Sit to Stand: Supervision         General transfer comment: Supervision for safety. No LOB noted.     Balance Overall balance assessment: Needs assistance Sitting-balance support: No upper extremity supported;Feet supported Sitting balance-Leahy Scale: Fair     Standing balance support: No upper extremity supported;During functional activity Standing balance-Leahy Scale: Fair                              ADL Overall ADL's : Needs assistance/impaired Eating/Feeding: Set up;Sitting   Grooming: Supervision/safety;Standing   Upper Body Bathing: Supervision/ safety;Standing   Lower Body Bathing: Supervison/  safety   Upper Body Dressing : Set up;Sitting   Lower Body Dressing: Minimal assistance;Sit to/from stand Lower Body Dressing Details (indicate cue type and reason): Assist with socks and shoes. Pt reports wife can assist as needed. Has a shoe horn at home to assist with donning shoes. Toilet Transfer: Supervision/safety;Ambulation;Regular Glass blower/designer Details (indicate cue type and reason): Simulated by transfer from EOB to chair Toileting- Clothing Manipulation and Hygiene: Supervision/safety;Sit to/from stand   Tub/ Shower Transfer: Supervision/safety;Ambulation;Walk-in shower   Functional mobility during ADLs: Supervision/safety General ADL Comments: No family present for OT eval. Pt reports he is having increased difficulty with word recall since sx. Educated on home safety, supervision for shower transfers and bathing in standing, use of seat in shower if pt feels weak of off balance; pt verbalized understanding.     Vision     Perception     Praxis      Pertinent Vitals/Pain Pain Assessment: 0-10 Pain Score: 5  Pain Location: head Pain Descriptors / Indicators: Aching;Headache Pain Intervention(s): Monitored during session;Premedicated before session;Repositioned     Hand Dominance Right   Extremity/Trunk Assessment Upper Extremity Assessment Upper Extremity Assessment: Generalized weakness   Lower Extremity Assessment Lower Extremity Assessment: Defer to PT evaluation   Cervical / Trunk Assessment Cervical / Trunk Assessment: Normal   Communication Communication Communication: No difficulties   Cognition Arousal/Alertness: Awake/alert Behavior During Therapy: WFL for tasks assessed/performed Overall Cognitive Status: Within Functional Limits for tasks assessed  General Comments       Exercises       Shoulder Instructions      Home Living Family/patient expects to be discharged to:: Private residence Living  Arrangements: Spouse/significant other Available Help at Discharge: Family;Available 24 hours/day Type of Home: House Home Access: Stairs to enter [employer] of Steps: 1-2   Home Layout: Two level;Laundry or work area in basement;Able to live on main level with bedroom/bathroom     Bathroom Shower/Tub: English as a second language teacher characteristics: Architectural technologist: Standard Bathroom Accessibility: Yes   Home Equipment: Cane - single point          Prior Functioning/Environment Level of Independence: Independent with assistive device(s)             OT Diagnosis: Generalized weakness;Acute pain   OT Problem List:     OT Treatment/Interventions:      OT Goals(Current goals can be found in the care plan section) Acute Rehab OT Goals Patient Stated Goal: home today OT Goal Formulation: All assessment and education complete, DC therapy  OT Frequency:     Barriers to D/C:            Co-evaluation              End of Session Nurse Communication: Other (comment) (no equipment needed)  Activity Tolerance: Patient tolerated treatment well Patient left: in chair;with call bell/phone within reach (with PT)   TimeLR:[id number] OT Time Calculation (min): 14 min Charges:  OT General Charges $OT Visit: 1 Procedure OT Evaluation $OT Eval Low Complexity: 1 Procedure G-Codes:     Binnie Kand M.S., OTR/L Pager: [phone]  08/01/2015, 8:19 AM

## 2015-08-03 ENCOUNTER — Encounter (HOSPITAL_COMMUNITY): Payer: Self-pay | Admitting: Neurosurgery

## 2015-08-03 ENCOUNTER — Telehealth: Payer: Self-pay | Admitting: Neurology

## 2015-08-03 NOTE — Telephone Encounter (Signed)
Tried to call back with no answer and no way to leave message.

## 2015-08-03 NOTE — Telephone Encounter (Signed)
PT's wife Stanton Kidney called in regards to PT and would like a call back/Dawn CB# [phone]

## 2015-08-03 NOTE — Telephone Encounter (Signed)
Follow up appt made with wife.

## 2015-08-03 NOTE — Telephone Encounter (Signed)
PT called and wanted a call back in regards to the surgery he had on Friday/Dawn CB# [phone]

## 2015-08-05 ENCOUNTER — Telehealth: Payer: Self-pay | Admitting: Neurology

## 2015-08-05 ENCOUNTER — Other Ambulatory Visit: Payer: Self-pay | Admitting: Neurosurgery

## 2015-08-05 ENCOUNTER — Encounter (HOSPITAL_COMMUNITY): Payer: Self-pay | Admitting: Neurosurgery

## 2015-08-05 NOTE — Telephone Encounter (Signed)
PT's wife Stanton Kidney called and would like a call back/Dawn CB# [phone]

## 2015-08-05 NOTE — Progress Notes (Addendum)
Spoke with pt for pre-op call. Pt verifies that nothing has changed with his allergies, medications, medical and surgical history since his surgery last week. Pt denies any chest pain or sob. Pt states fasting blood sugar is running around 134, last A1C was 7.4 on 07/16/15. Instructed pt to take 44 units (80%) of Toujeo Thursday PM and not to take any of his oral diabetic medications the morning of surgery. Also instructed pt to check his blood sugar the morning of surgery and if blood sugar is 70 or less, to treat it with 1/2 cup (4 oz.) of clear juice (apple or cranberry). Then recheck blood sugar fifteen minutes after drinking juice. These instructions given per Diabetes Medication Adjustment Guidelines Prior to Procedure and Surgery. Pt voiced understanding.   EKG - 07/16/15 - in EPIC

## 2015-08-05 NOTE — Telephone Encounter (Signed)
Spoke with patient and he can not remember why his wife called. She is unavailable. If she needs me I told them to call me back.

## 2015-08-06 MED ORDER — DEXTROSE 5 % IV SOLN
3.0000 g | INTRAVENOUS | Status: AC
  Administered 2015-08-07: 3 g via INTRAVENOUS
  Filled 2015-08-06 (×2): qty 3000

## 2015-08-06 NOTE — Discharge Instructions (Signed)
Wound Care Leave incision open to air. You may shower. Do not scrub directly on incision.  Do not put any creams, lotions, or ointments on incision. Activity Walk each and every day, increasing distance each day. No lifting greater than 5 lbs.  Diet Resume your normal diet.  Return to Work Will be discussed at you follow up appointment. Call Your Doctor If Any of These Occur Redness, drainage, or swelling at the wound.  Temperature greater than 101 degrees. Severe pain not relieved by pain medication. Incision starts to come apart. Follow Up Appt Call today for appointment [phone] or for problems.

## 2015-08-07 ENCOUNTER — Encounter (HOSPITAL_COMMUNITY)
Admission: RE | Disposition: A | Discharge status: Home / Self Care | Payer: Self-pay | Source: Ambulatory Visit | Attending: Neurosurgery

## 2015-08-07 ENCOUNTER — Ambulatory Visit (HOSPITAL_COMMUNITY)
Admission: RE | Admit: 2015-08-07 | Discharge: 2015-08-08 | Disposition: A | Discharge status: Home / Self Care | Payer: Medicare Other | Source: Ambulatory Visit | Attending: Neurosurgery | Admitting: Neurosurgery

## 2015-08-07 ENCOUNTER — Encounter (HOSPITAL_COMMUNITY): Payer: Self-pay | Admitting: General Practice

## 2015-08-07 ENCOUNTER — Ambulatory Visit (HOSPITAL_COMMUNITY): Payer: Medicare Other | Admitting: Certified Registered Nurse Anesthetist

## 2015-08-07 DIAGNOSIS — G20 Parkinson's disease: Secondary | ICD-10-CM | POA: Diagnosis not present

## 2015-08-07 DIAGNOSIS — Z8249 Family history of ischemic heart disease and other diseases of the circulatory system: Secondary | ICD-10-CM | POA: Insufficient documentation

## 2015-08-07 DIAGNOSIS — Z794 Long term (current) use of insulin: Secondary | ICD-10-CM | POA: Insufficient documentation

## 2015-08-07 DIAGNOSIS — E1142 Type 2 diabetes mellitus with diabetic polyneuropathy: Secondary | ICD-10-CM | POA: Diagnosis not present

## 2015-08-07 DIAGNOSIS — E785 Hyperlipidemia, unspecified: Secondary | ICD-10-CM | POA: Insufficient documentation

## 2015-08-07 DIAGNOSIS — N400 Enlarged prostate without lower urinary tract symptoms: Secondary | ICD-10-CM | POA: Insufficient documentation

## 2015-08-07 DIAGNOSIS — R339 Retention of urine, unspecified: Secondary | ICD-10-CM | POA: Insufficient documentation

## 2015-08-07 DIAGNOSIS — I10 Essential (primary) hypertension: Secondary | ICD-10-CM | POA: Diagnosis not present

## 2015-08-07 DIAGNOSIS — G250 Essential tremor: Secondary | ICD-10-CM | POA: Diagnosis present

## 2015-08-07 DIAGNOSIS — Z7982 Long term (current) use of aspirin: Secondary | ICD-10-CM | POA: Insufficient documentation

## 2015-08-07 DIAGNOSIS — Z87891 Personal history of nicotine dependence: Secondary | ICD-10-CM | POA: Insufficient documentation

## 2015-08-07 DIAGNOSIS — Z833 Family history of diabetes mellitus: Secondary | ICD-10-CM | POA: Insufficient documentation

## 2015-08-07 HISTORY — PX: PULSE GENERATOR IMPLANT: SHX5370

## 2015-08-07 LAB — BASIC METABOLIC PANEL
Anion gap: 14 (ref 5–15)
BUN: 53 mg/dL — AB (ref 6–20)
CALCIUM: 9.6 mg/dL (ref 8.9–10.3)
CO2: 20 mmol/L — AB (ref 22–32)
CREATININE: 1.72 mg/dL — AB (ref 0.61–1.24)
Chloride: 107 mmol/L (ref 101–111)
GFR calc Af Amer: 43 mL/min — ABNORMAL LOW (ref >60)
GFR calc non Af Amer: 37 mL/min — ABNORMAL LOW (ref >60)
GLUCOSE: 132 mg/dL — AB (ref 65–99)
Potassium: 3.9 mmol/L (ref 3.5–5.1)
Sodium: 141 mmol/L (ref 135–145)

## 2015-08-07 LAB — CBC
HEMATOCRIT: 37.9 % — AB (ref 39.0–52.0)
Hemoglobin: 12.9 g/dL — ABNORMAL LOW (ref 13.0–17.0)
MCH: 34.2 pg — ABNORMAL HIGH (ref 26.0–34.0)
MCHC: 34 g/dL (ref 30.0–36.0)
MCV: 100.5 fL — AB (ref 78.0–100.0)
Platelets: 189 10*3/uL (ref 150–400)
RBC: 3.77 MIL/uL — ABNORMAL LOW (ref 4.22–5.81)
RDW: 14 % (ref 11.5–15.5)
WBC: 5.7 10*3/uL (ref 4.0–10.5)

## 2015-08-07 LAB — SURGICAL PCR SCREEN
MRSA, PCR: NEGATIVE
STAPHYLOCOCCUS AUREUS: NEGATIVE

## 2015-08-07 LAB — GLUCOSE, CAPILLARY
GLUCOSE-CAPILLARY: 128 mg/dL — AB (ref 65–99)
GLUCOSE-CAPILLARY: 133 mg/dL — AB (ref 65–99)
GLUCOSE-CAPILLARY: 172 mg/dL — AB (ref 65–99)
Glucose-Capillary: 175 mg/dL — ABNORMAL HIGH (ref 65–99)
Glucose-Capillary: 215 mg/dL — ABNORMAL HIGH (ref 65–99)

## 2015-08-07 SURGERY — UNILATERAL PULSE GENERATOR IMPLANT
Anesthesia: General | Site: Chest | Laterality: Left

## 2015-08-07 MED ORDER — LACTATED RINGERS IV SOLN
INTRAVENOUS | Status: DC | PRN
  Administered 2015-08-07: 07:00:00 via INTRAVENOUS

## 2015-08-07 MED ORDER — 0.9 % SODIUM CHLORIDE (POUR BTL) OPTIME
TOPICAL | Status: DC | PRN
  Administered 2015-08-07: 1000 mL

## 2015-08-07 MED ORDER — ONDANSETRON HCL 4 MG/2ML IJ SOLN: 4.0000 mg | INTRAMUSCULAR | Status: DC | PRN

## 2015-08-07 MED ORDER — PROPOFOL 10 MG/ML IV BOLUS
INTRAVENOUS | Status: AC
  Filled 2015-08-07: qty 20

## 2015-08-07 MED ORDER — OXYCODONE-ACETAMINOPHEN 5-325 MG PO TABS: 1.0000 | ORAL_TABLET | ORAL | Status: DC | PRN

## 2015-08-07 MED ORDER — CANAGLIFLOZIN 100 MG PO TABS
100.0000 mg | ORAL_TABLET | Freq: Every day | ORAL | Status: DC
  Administered 2015-08-08: 100 mg via ORAL
  Filled 2015-08-07 (×2): qty 1

## 2015-08-07 MED ORDER — FENTANYL CITRATE (PF) 100 MCG/2ML IJ SOLN: 25.0000 ug | INTRAMUSCULAR | Status: DC | PRN

## 2015-08-07 MED ORDER — PANTOPRAZOLE SODIUM 40 MG IV SOLR: 40.0000 mg | Freq: Every day | INTRAVENOUS | Status: DC

## 2015-08-07 MED ORDER — SERTRALINE HCL 100 MG PO TABS
100.0000 mg | ORAL_TABLET | Freq: Every day | ORAL | Status: DC
  Administered 2015-08-08: 100 mg via ORAL
  Filled 2015-08-07: qty 2

## 2015-08-07 MED ORDER — POTASSIUM 99 MG PO TABS: 99.0000 mg | ORAL_TABLET | Freq: Every day | ORAL | Status: DC

## 2015-08-07 MED ORDER — ROCURONIUM BROMIDE 50 MG/5ML IV SOLN
INTRAVENOUS | Status: AC
  Filled 2015-08-07: qty 1

## 2015-08-07 MED ORDER — DEXTROSE 5 % IV SOLN
3.0000 g | Freq: Three times a day (TID) | INTRAVENOUS | Status: AC
  Administered 2015-08-07 (×2): 3 g via INTRAVENOUS
  Filled 2015-08-07 (×2): qty 3000

## 2015-08-07 MED ORDER — EPHEDRINE SULFATE 50 MG/ML IJ SOLN
INTRAMUSCULAR | Status: DC | PRN
  Administered 2015-08-07: 5 mg via INTRAVENOUS
  Administered 2015-08-07 (×3): 10 mg via INTRAVENOUS
  Administered 2015-08-07: 50 mg via INTRAVENOUS
  Administered 2015-08-07 (×2): 10 mg via INTRAVENOUS

## 2015-08-07 MED ORDER — PROMETHAZINE HCL 25 MG/ML IJ SOLN: 6.2500 mg | INTRAMUSCULAR | Status: DC | PRN

## 2015-08-07 MED ORDER — MUPIROCIN 2 % EX OINT
1.0000 "application " | TOPICAL_OINTMENT | Freq: Once | CUTANEOUS | Status: AC
  Administered 2015-08-07: 1 via TOPICAL

## 2015-08-07 MED ORDER — PHENYLEPHRINE HCL 10 MG/ML IJ SOLN
INTRAMUSCULAR | Status: DC | PRN
  Administered 2015-08-07 (×4): 80 ug via INTRAVENOUS

## 2015-08-07 MED ORDER — BISACODYL 10 MG RE SUPP: 10.0000 mg | Freq: Every day | RECTAL | Status: DC | PRN

## 2015-08-07 MED ORDER — ASPIRIN EC 81 MG PO TBEC
81.0000 mg | DELAYED_RELEASE_TABLET | Freq: Every day | ORAL | Status: DC
Start: 2015-08-07 — End: 2015-08-08
  Administered 2015-08-07 – 2015-08-08 (×2): 81 mg via ORAL
  Filled 2015-08-07 (×2): qty 1

## 2015-08-07 MED ORDER — LIDOCAINE HCL (CARDIAC) 20 MG/ML IV SOLN
INTRAVENOUS | Status: DC | PRN
  Administered 2015-08-07: 50 mg via INTRAVENOUS

## 2015-08-07 MED ORDER — OXYBUTYNIN CHLORIDE 5 MG PO TABS
5.0000 mg | ORAL_TABLET | Freq: Three times a day (TID) | ORAL | Status: DC
  Administered 2015-08-07 – 2015-08-08 (×4): 5 mg via ORAL
  Filled 2015-08-07 (×6): qty 1

## 2015-08-07 MED ORDER — PROSIGHT PO TABS
1.0000 | ORAL_TABLET | Freq: Every day | ORAL | Status: DC
  Administered 2015-08-07 – 2015-08-08 (×2): 1 via ORAL
  Filled 2015-08-07 (×2): qty 1

## 2015-08-07 MED ORDER — SODIUM CHLORIDE 0.9% FLUSH
3.0000 mL | Freq: Two times a day (BID) | INTRAVENOUS | Status: DC
  Administered 2015-08-07 (×2): 3 mL via INTRAVENOUS

## 2015-08-07 MED ORDER — ROCURONIUM BROMIDE 100 MG/10ML IV SOLN
INTRAVENOUS | Status: DC | PRN
  Administered 2015-08-07: 10 mg via INTRAVENOUS
  Administered 2015-08-07: 40 mg via INTRAVENOUS

## 2015-08-07 MED ORDER — FLAXSEED OIL 1000 MG PO CAPS: 1000.0000 mg | ORAL_CAPSULE | Freq: Every day | ORAL | Status: DC

## 2015-08-07 MED ORDER — SODIUM CHLORIDE 0.9% FLUSH: 3.0000 mL | INTRAVENOUS | Status: DC | PRN

## 2015-08-07 MED ORDER — GLYCOPYRROLATE 0.2 MG/ML IJ SOLN
INTRAMUSCULAR | Status: DC | PRN
  Administered 2015-08-07: 0.2 mg via INTRAVENOUS
  Administered 2015-08-07: .8 mg via INTRAVENOUS

## 2015-08-07 MED ORDER — ALUM & MAG HYDROXIDE-SIMETH 200-200-20 MG/5ML PO SUSP: 30.0000 mL | Freq: Four times a day (QID) | ORAL | Status: DC | PRN

## 2015-08-07 MED ORDER — LOSARTAN POTASSIUM 50 MG PO TABS
100.0000 mg | ORAL_TABLET | Freq: Every day | ORAL | Status: DC
  Administered 2015-08-07 – 2015-08-08 (×2): 100 mg via ORAL
  Filled 2015-08-07 (×2): qty 2

## 2015-08-07 MED ORDER — FLEET ENEMA 7-19 GM/118ML RE ENEM: 1.0000 | ENEMA | Freq: Once | RECTAL | Status: DC | PRN

## 2015-08-07 MED ORDER — ACETAMINOPHEN 650 MG RE SUPP: 650.0000 mg | RECTAL | Status: DC | PRN

## 2015-08-07 MED ORDER — MIDAZOLAM HCL 2 MG/2ML IJ SOLN
INTRAMUSCULAR | Status: AC
  Filled 2015-08-07: qty 2

## 2015-08-07 MED ORDER — VITAMIN D3 25 MCG (1000 UNIT) PO TABS
3000.0000 [IU] | ORAL_TABLET | Freq: Every day | ORAL | Status: DC
  Administered 2015-08-07 – 2015-08-08 (×2): 3000 [IU] via ORAL
  Filled 2015-08-07 (×4): qty 3

## 2015-08-07 MED ORDER — HYDROCHLOROTHIAZIDE 25 MG PO TABS
25.0000 mg | ORAL_TABLET | Freq: Every day | ORAL | Status: DC
  Administered 2015-08-07 – 2015-08-08 (×2): 25 mg via ORAL
  Filled 2015-08-07 (×2): qty 1

## 2015-08-07 MED ORDER — CEPHALEXIN 500 MG PO CAPS
500.0000 mg | ORAL_CAPSULE | Freq: Three times a day (TID) | ORAL | Status: DC
  Administered 2015-08-08 (×2): 500 mg via ORAL
  Filled 2015-08-07 (×4): qty 1

## 2015-08-07 MED ORDER — LOSARTAN POTASSIUM-HCTZ 100-25 MG PO TABS: 1.0000 | ORAL_TABLET | Freq: Every day | ORAL | Status: DC

## 2015-08-07 MED ORDER — KCL IN DEXTROSE-NACL 20-5-0.45 MEQ/L-%-% IV SOLN: INTRAVENOUS | Status: DC

## 2015-08-07 MED ORDER — GABAPENTIN 600 MG PO TABS
600.0000 mg | ORAL_TABLET | Freq: Two times a day (BID) | ORAL | Status: DC
  Administered 2015-08-07 – 2015-08-08 (×3): 600 mg via ORAL
  Filled 2015-08-07 (×3): qty 1

## 2015-08-07 MED ORDER — FENTANYL CITRATE (PF) 100 MCG/2ML IJ SOLN
INTRAMUSCULAR | Status: DC | PRN
  Administered 2015-08-07 (×3): 50 ug via INTRAVENOUS

## 2015-08-07 MED ORDER — ADULT MULTIVITAMIN W/MINERALS CH: 1.0000 | ORAL_TABLET | Freq: Every day | ORAL | Status: DC

## 2015-08-07 MED ORDER — CLOTRIMAZOLE 1 % EX CREA
1.0000 "application " | TOPICAL_CREAM | Freq: Two times a day (BID) | CUTANEOUS | Status: DC
  Filled 2015-08-07: qty 15

## 2015-08-07 MED ORDER — BUPIVACAINE HCL (PF) 0.5 % IJ SOLN
INTRAMUSCULAR | Status: DC | PRN
  Administered 2015-08-07: 8 mL

## 2015-08-07 MED ORDER — LACTATED RINGERS IV SOLN: INTRAVENOUS | Status: DC

## 2015-08-07 MED ORDER — DEXAMETHASONE SODIUM PHOSPHATE 4 MG/ML IJ SOLN
INTRAMUSCULAR | Status: DC | PRN
  Administered 2015-08-07: 8 mg via INTRAVENOUS

## 2015-08-07 MED ORDER — PIMECROLIMUS 1 % EX CREA: 1.0000 "application " | TOPICAL_CREAM | Freq: Every day | CUTANEOUS | Status: DC | PRN

## 2015-08-07 MED ORDER — MEPERIDINE HCL 25 MG/ML IJ SOLN: 6.2500 mg | INTRAMUSCULAR | Status: DC | PRN

## 2015-08-07 MED ORDER — INSULIN GLARGINE 100 UNIT/ML ~~LOC~~ SOLN
55.0000 [IU] | Freq: Every day | SUBCUTANEOUS | Status: DC
  Administered 2015-08-07: 55 [IU] via SUBCUTANEOUS
  Filled 2015-08-07 (×2): qty 0.55

## 2015-08-07 MED ORDER — ONDANSETRON HCL 4 MG/2ML IJ SOLN
INTRAMUSCULAR | Status: AC
  Filled 2015-08-07: qty 2

## 2015-08-07 MED ORDER — DEXAMETHASONE SODIUM PHOSPHATE 4 MG/ML IJ SOLN
INTRAMUSCULAR | Status: AC
  Filled 2015-08-07: qty 2

## 2015-08-07 MED ORDER — POLYETHYLENE GLYCOL 3350 17 G PO PACK: 17.0000 g | PACK | Freq: Every day | ORAL | Status: DC | PRN

## 2015-08-07 MED ORDER — TAMSULOSIN HCL 0.4 MG PO CAPS
0.4000 mg | ORAL_CAPSULE | Freq: Every day | ORAL | Status: DC
  Administered 2015-08-07 – 2015-08-08 (×3): 0.4 mg via ORAL
  Filled 2015-08-07 (×3): qty 1

## 2015-08-07 MED ORDER — FENTANYL CITRATE (PF) 250 MCG/5ML IJ SOLN
INTRAMUSCULAR | Status: AC
  Filled 2015-08-07: qty 5

## 2015-08-07 MED ORDER — PROPOFOL 10 MG/ML IV BOLUS
INTRAVENOUS | Status: DC | PRN
  Administered 2015-08-07: 200 mg via INTRAVENOUS

## 2015-08-07 MED ORDER — LABETALOL HCL 200 MG PO TABS
300.0000 mg | ORAL_TABLET | Freq: Two times a day (BID) | ORAL | Status: DC
  Administered 2015-08-07 – 2015-08-08 (×2): 300 mg via ORAL
  Filled 2015-08-07 (×3): qty 1

## 2015-08-07 MED ORDER — NEOSTIGMINE METHYLSULFATE 10 MG/10ML IV SOLN
INTRAVENOUS | Status: DC | PRN
  Administered 2015-08-07: 5 mg via INTRAVENOUS

## 2015-08-07 MED ORDER — PHENOL 1.4 % MT LIQD: 1.0000 | OROMUCOSAL | Status: DC | PRN

## 2015-08-07 MED ORDER — AMLODIPINE BESYLATE 10 MG PO TABS
10.0000 mg | ORAL_TABLET | Freq: Every day | ORAL | Status: DC
  Administered 2015-08-08: 10 mg via ORAL
  Filled 2015-08-07 (×2): qty 1

## 2015-08-07 MED ORDER — LIDOCAINE HCL (CARDIAC) 20 MG/ML IV SOLN
INTRAVENOUS | Status: AC
  Filled 2015-08-07: qty 5

## 2015-08-07 MED ORDER — GEMFIBROZIL 600 MG PO TABS
600.0000 mg | ORAL_TABLET | Freq: Two times a day (BID) | ORAL | Status: DC
  Administered 2015-08-07 – 2015-08-08 (×2): 600 mg via ORAL
  Filled 2015-08-07 (×4): qty 1

## 2015-08-07 MED ORDER — HYDROCODONE-ACETAMINOPHEN 5-325 MG PO TABS: 1.0000 | ORAL_TABLET | ORAL | Status: DC | PRN

## 2015-08-07 MED ORDER — VANCOMYCIN HCL 1000 MG IV SOLR
INTRAVENOUS | Status: AC
  Filled 2015-08-07: qty 1000

## 2015-08-07 MED ORDER — VANCOMYCIN HCL 1000 MG IV SOLR
INTRAVENOUS | Status: DC | PRN
  Administered 2015-08-07: 1000 mg

## 2015-08-07 MED ORDER — ARTIFICIAL TEARS OP OINT
TOPICAL_OINTMENT | OPHTHALMIC | Status: DC | PRN
Start: 2015-08-07 — End: 2015-08-07
  Administered 2015-08-07: 1 via OPHTHALMIC

## 2015-08-07 MED ORDER — ATORVASTATIN CALCIUM 40 MG PO TABS
40.0000 mg | ORAL_TABLET | Freq: Every day | ORAL | Status: DC
  Administered 2015-08-08: 40 mg via ORAL
  Filled 2015-08-07: qty 2

## 2015-08-07 MED ORDER — METHOCARBAMOL 1000 MG/10ML IJ SOLN
500.0000 mg | Freq: Four times a day (QID) | INTRAVENOUS | Status: DC | PRN
  Filled 2015-08-07: qty 5

## 2015-08-07 MED ORDER — MENTHOL 3 MG MT LOZG: 1.0000 | LOZENGE | OROMUCOSAL | Status: DC | PRN

## 2015-08-07 MED ORDER — PRIMIDONE 250 MG PO TABS
250.0000 mg | ORAL_TABLET | Freq: Every day | ORAL | Status: DC
  Administered 2015-08-07: 250 mg via ORAL
  Filled 2015-08-07 (×2): qty 1

## 2015-08-07 MED ORDER — ONDANSETRON HCL 4 MG/2ML IJ SOLN
INTRAMUSCULAR | Status: DC | PRN
  Administered 2015-08-07: 4 mg via INTRAVENOUS

## 2015-08-07 MED ORDER — GLIMEPIRIDE 4 MG PO TABS
4.0000 mg | ORAL_TABLET | Freq: Every day | ORAL | Status: DC
  Administered 2015-08-08: 4 mg via ORAL
  Filled 2015-08-07: qty 1

## 2015-08-07 MED ORDER — LIDOCAINE-EPINEPHRINE 1 %-1:100000 IJ SOLN
INTRAMUSCULAR | Status: DC | PRN
  Administered 2015-08-07: 8 mL

## 2015-08-07 MED ORDER — FINASTERIDE 5 MG PO TABS
5.0000 mg | ORAL_TABLET | Freq: Every day | ORAL | Status: DC
  Administered 2015-08-07 – 2015-08-08 (×2): 5 mg via ORAL
  Filled 2015-08-07 (×3): qty 1

## 2015-08-07 MED ORDER — MUPIROCIN 2 % EX OINT
TOPICAL_OINTMENT | CUTANEOUS | Status: AC
  Filled 2015-08-07: qty 22

## 2015-08-07 MED ORDER — VITAMIN B-12 1000 MCG PO TABS
1000.0000 ug | ORAL_TABLET | Freq: Every day | ORAL | Status: DC
  Administered 2015-08-07 – 2015-08-08 (×2): 1000 ug via ORAL
  Filled 2015-08-07 (×3): qty 1

## 2015-08-07 MED ORDER — LINAGLIPTIN 5 MG PO TABS
5.0000 mg | ORAL_TABLET | Freq: Every day | ORAL | Status: DC
  Administered 2015-08-07 – 2015-08-08 (×2): 5 mg via ORAL
  Filled 2015-08-07 (×3): qty 1

## 2015-08-07 MED ORDER — HYDROCODONE-ACETAMINOPHEN 5-325 MG PO TABS
1.0000 | ORAL_TABLET | ORAL | Status: DC | PRN
  Administered 2015-08-07 (×2): 2 via ORAL
  Administered 2015-08-08: 1 via ORAL
  Filled 2015-08-07 (×2): qty 2
  Filled 2015-08-07: qty 1

## 2015-08-07 MED ORDER — METHOCARBAMOL 500 MG PO TABS
500.0000 mg | ORAL_TABLET | Freq: Four times a day (QID) | ORAL | Status: DC | PRN
  Administered 2015-08-07: 500 mg via ORAL
  Filled 2015-08-07: qty 1

## 2015-08-07 MED ORDER — ZOLPIDEM TARTRATE 5 MG PO TABS: 5.0000 mg | ORAL_TABLET | Freq: Every evening | ORAL | Status: DC | PRN

## 2015-08-07 MED ORDER — ACETAMINOPHEN 325 MG PO TABS: 650.0000 mg | ORAL_TABLET | ORAL | Status: DC | PRN

## 2015-08-07 MED ORDER — HYDROMORPHONE HCL 1 MG/ML IJ SOLN: 0.5000 mg | INTRAMUSCULAR | Status: DC | PRN

## 2015-08-07 MED ORDER — DOCUSATE SODIUM 100 MG PO CAPS
100.0000 mg | ORAL_CAPSULE | Freq: Two times a day (BID) | ORAL | Status: DC
  Administered 2015-08-07 – 2015-08-08 (×3): 100 mg via ORAL
  Filled 2015-08-07 (×3): qty 1

## 2015-08-07 MED ORDER — PANTOPRAZOLE SODIUM 40 MG PO TBEC
40.0000 mg | DELAYED_RELEASE_TABLET | Freq: Every day | ORAL | Status: DC
  Administered 2015-08-07 – 2015-08-08 (×2): 40 mg via ORAL
  Filled 2015-08-07 (×2): qty 1

## 2015-08-07 SURGICAL SUPPLY — 55 items
BANDAGE ADH SHEER 1  50/CT (GAUZE/BANDAGES/DRESSINGS) IMPLANT
BENZOIN TINCTURE PRP APPL 2/3 (GAUZE/BANDAGES/DRESSINGS) IMPLANT
BLADE CLIPPER SURG (BLADE) IMPLANT
BLADE SURG 10 STRL SS (BLADE) ×3 IMPLANT
BLADE SURG 11 STRL SS (BLADE) ×6 IMPLANT
BLADE SURG 15 STRL LF DISP TIS (BLADE) ×1 IMPLANT
BLADE SURG 15 STRL SS (BLADE) ×2
BOOT SUTURE AID YELLOW STND (SUTURE) IMPLANT
CANISTER SUCT 3000ML PPV (MISCELLANEOUS) ×3 IMPLANT
CLIP RANEY DISP (INSTRUMENTS) IMPLANT
COIL EXT DBS STRETCH 40 (Lead) ×3 IMPLANT
CORDS BIPOLAR (ELECTRODE) ×3 IMPLANT
COVER BACK TABLE 60X90IN (DRAPES) IMPLANT
COVER MAYO STAND STRL (DRAPES) ×3 IMPLANT
DECANTER SPIKE VIAL GLASS SM (MISCELLANEOUS) ×3 IMPLANT
DERMABOND ADHESIVE PROPEN (GAUZE/BANDAGES/DRESSINGS) ×2
DERMABOND ADVANCED .7 DNX6 (GAUZE/BANDAGES/DRESSINGS) ×1 IMPLANT
DRAPE C-ARM 42X72 X-RAY (DRAPES) IMPLANT
DRAPE CAMERA VIDEO/LASER (DRAPES) ×3 IMPLANT
DRAPE INCISE IOBAN 66X45 STRL (DRAPES) ×3 IMPLANT
DRSG OPSITE POSTOP 4X6 (GAUZE/BANDAGES/DRESSINGS) ×3 IMPLANT
DURAPREP 26ML APPLICATOR (WOUND CARE) ×3 IMPLANT
ELECT CAUTERY BLADE 6.4 (BLADE) ×3 IMPLANT
EXTENSION KIT 40 CM ×2 IMPLANT
GAUZE SPONGE 4X4 12PLY STRL (GAUZE/BANDAGES/DRESSINGS) IMPLANT
GAUZE SPONGE 4X4 16PLY XRAY LF (GAUZE/BANDAGES/DRESSINGS) ×3 IMPLANT
GLOVE BIO SURGEON STRL SZ8 (GLOVE) ×9 IMPLANT
GLOVE ECLIPSE 7.5 STRL STRAW (GLOVE) ×3 IMPLANT
GLOVE INDICATOR 8.0 STRL GRN (GLOVE) ×3 IMPLANT
GLOVE INDICATOR 8.5 STRL (GLOVE) ×3 IMPLANT
KIT ACCESSORY (KITS) ×2
KIT ACCESSORY NEUROSURG SU (KITS) ×1 IMPLANT
KIT BASIN OR (CUSTOM PROCEDURE TRAY) ×3 IMPLANT
LEAD DBS (Neuro Prosthesis/Implant) ×3 IMPLANT
MARKER SKIN DUAL TIP RULER LAB (MISCELLANEOUS) ×6 IMPLANT
NEEDLE HYPO 25X1 1.5 SAFETY (NEEDLE) ×3 IMPLANT
NEEDLE SPNL 18GX3.5 QUINCKE PK (NEEDLE) IMPLANT
NEEDLE SPNL 22GX3.5 QUINCKE BK (NEEDLE) IMPLANT
NEUROSTIM OCTOPOLAR ~~LOC~~ 60X55 (Neuro Prosthesis/Implant) ×3 IMPLANT
NEUROSTIM PROGRAMMER 2.2X3.7 (Neuro Prosthesis/Implant) ×3 IMPLANT
PACK EENT II TURBAN DRAPE (CUSTOM PROCEDURE TRAY) ×3 IMPLANT
PAD ARMBOARD 7.5X6 YLW CONV (MISCELLANEOUS) ×6 IMPLANT
PATIENT PROGRAMMER ×3 IMPLANT
PATTIES SURGICAL 1X1 (DISPOSABLE) IMPLANT
PENCIL BUTTON HOLSTER BLD 10FT (ELECTRODE) ×3 IMPLANT
STAPLER SKIN PROX WIDE 3.9 (STAPLE) ×3 IMPLANT
SUT BONE WAX W31G (SUTURE) ×3 IMPLANT
SUT ETHILON 3 0 PS 1 (SUTURE) IMPLANT
SUT SILK 2 0 TIES 10X30 (SUTURE) ×3 IMPLANT
SUT VIC AB 2-0 CP2 18 (SUTURE) ×3 IMPLANT
SYR BULB 3OZ (MISCELLANEOUS) ×3 IMPLANT
SYR CONTROL 10ML LL (SYRINGE) ×3 IMPLANT
TOOL TUNNELING (INSTRUMENTS) ×3 IMPLANT
TUBE CONNECTING 12'X1/4 (SUCTIONS) ×1
TUBE CONNECTING 12X1/4 (SUCTIONS) ×2 IMPLANT

## 2015-08-07 NOTE — Anesthesia Postprocedure Evaluation (Signed)
Anesthesia Post Note  Patient: Logan Griffith  Procedure(s) Performed: Procedure(s) (LRB): UNILATERAL PULSE GENERATOR IMPLANT (LEFT) (Left)  Patient location during evaluation: PACU Anesthesia Type: General Level of consciousness: awake and alert Pain management: pain level controlled Vital Signs Assessment: post-procedure vital signs reviewed and stable Respiratory status: spontaneous breathing, nonlabored ventilation, respiratory function stable and patient connected to nasal cannula oxygen Cardiovascular status: blood pressure returned to baseline and stable Postop Assessment: no signs of nausea or vomiting Anesthetic complications: no    Last Vitals:  Filed Vitals:   08/07/15 0959 08/07/15 1017  BP: 132/65 138/60  Pulse: 67 65  Temp:  36.4 C  Resp: 18 18    Last Pain: There were no vitals filed for this visit.               Effie Berkshire

## 2015-08-07 NOTE — Brief Op Note (Signed)
08/07/2015  9:13 AM  PATIENT:  Logan Griffith  75 y.o. male  PRE-OPERATIVE DIAGNOSIS:  ESSENTIAL TREMOR, s/p left DBS VIM electrode placement  POST-OPERATIVE DIAGNOSIS:  ESSENTIAL TREMOR, s/p left DBS VIM electrode placement  PROCEDURE:  Procedure(s) with comments: UNILATERAL PULSE GENERATOR IMPLANT (LEFT) (Left) - UNILATERAL PULSE GENERATOR IMPLANT (LEFT) with extensions  SURGEON:  Surgeon(s) and Role:    * Erline Levine, MD - Primary  PHYSICIAN ASSISTANT:   ASSISTANTS: Poteat, RN   ANESTHESIA:   general  EBL:     BLOOD ADMINISTERED:none  DRAINS: none   LOCAL MEDICATIONS USED:  LIDOCAINE   SPECIMEN:  No Specimen  DISPOSITION OF SPECIMEN:  N/A  COUNTS:  YES  TOURNIQUET:  * No tourniquets in log *  DICTATION: Patient has implanted left VIM DBS stimulator electrodes having recently completed DBS Stage I and now presents for placement of lead extensions and IPG implantation.  PROCEDURE: Patient was brought to the operating room and GETA anesthesia was induced.  Left upper chest, scalp, neck were prepped with betadine scrub and Duraprep.  Area of planned incision was infiltrated with lidocaine.  Scalp incision was made and the lead extensions were exposed. An incision was made in the left upper chest and a pocket was created.  Extension tunnel was made from scalp to pocket.  IPG was placed and attached to lead extensions, which in turn were connected to cranial leads and torqued appropriately.  Covering boot was placed along with 2-0 silk ties.   The IPG  was placed in the pocket.  Wounds were irrigated with vancomycin. Impedances were checked and confirmed.   Incisions were closed with 2-0 Vicryl and 3-0 vicryl sutures at the pocket and 2-0 vicryl at the scalp with staples. and dressed with a sterile occlusive dressing.  Counts were correct at the end of the case.  PLAN OF CARE: Admit for overnight observation  PATIENT DISPOSITION:  PACU - hemodynamically stable.   Delay start  of Pharmacological VTE agent (>24hrs) due to surgical blood loss or risk of bleeding: yes

## 2015-08-07 NOTE — Op Note (Signed)
08/07/2015  9:13 AM  PATIENT:  Logan Griffith  75 y.o. male  PRE-OPERATIVE DIAGNOSIS:  ESSENTIAL TREMOR, s/p left DBS VIM electrode placement  POST-OPERATIVE DIAGNOSIS:  ESSENTIAL TREMOR, s/p left DBS VIM electrode placement  PROCEDURE:  Procedure(s) with comments: UNILATERAL PULSE GENERATOR IMPLANT (LEFT) (Left) - UNILATERAL PULSE GENERATOR IMPLANT (LEFT) with extensions  SURGEON:  Surgeon(s) and Role:    * Erline Levine, MD - Primary  PHYSICIAN ASSISTANT:   ASSISTANTS: Poteat, RN   ANESTHESIA:   general  EBL:     BLOOD ADMINISTERED:none  DRAINS: none   LOCAL MEDICATIONS USED:  LIDOCAINE   SPECIMEN:  No Specimen  DISPOSITION OF SPECIMEN:  N/A  COUNTS:  YES  TOURNIQUET:  * No tourniquets in log *  DICTATION: Patient has implanted left VIM DBS stimulator electrodes having recently completed DBS Stage I and now presents for placement of lead extensions and IPG implantation.  PROCEDURE: Patient was brought to the operating room and GETA anesthesia was induced.  Left upper chest, scalp, neck were prepped with betadine scrub and Duraprep.  Area of planned incision was infiltrated with lidocaine.  Scalp incision was made and the lead extensions were exposed. An incision was made in the left upper chest and a pocket was created.  Extension tunnel was made from scalp to pocket.  IPG was placed and attached to lead extensions, which in turn were connected to cranial leads and torqued appropriately.  Covering boot was placed along with 2-0 silk ties.   The IPG  was placed in the pocket.  Wounds were irrigated with vancomycin. Impedances were checked and confirmed.   Incisions were closed with 2-0 Vicryl and 3-0 vicryl sutures at the pocket and 2-0 vicryl at the scalp with staples. and dressed with a sterile occlusive dressing.  Counts were correct at the end of the case.  PLAN OF CARE: Admit for overnight observation  PATIENT DISPOSITION:  PACU - hemodynamically stable.   Delay start  of Pharmacological VTE agent (>24hrs) due to surgical blood loss or risk of bleeding: yes

## 2015-08-07 NOTE — Interval H&P Note (Signed)
History and Physical Interval Note:  08/07/2015 7:26 AM  Logan Griffith  has presented today for surgery, with the diagnosis of ESSENTIAL TREMOR  The various methods of treatment have been discussed with the patient and family. After consideration of risks, benefits and other options for treatment, the patient has consented to  Procedure(s) with comments: UNILATERAL PULSE GENERATOR IMPLANT (LEFT) (Left) - UNILATERAL PULSE GENERATOR IMPLANT (LEFT) as a surgical intervention .  The patient's history has been reviewed, patient examined, no change in status, stable for surgery.  I have reviewed the patient's chart and labs.  Questions were answered to the patient's satisfaction.     Curtistine Pettitt D

## 2015-08-07 NOTE — Evaluation (Signed)
Physical Therapy Evaluation & Discharge Patient Details Name: Logan Griffith MRN: [id number] DOB: 17-Aug-1940 Today's Date: 08/07/2015   History of Present Illness  Pt is a 75 y.o male with a PMH significant for essential tremor. Pt presents s/p UNILATERAL PULSE GENERATOR IMPLANT (LEFT) with extensions for DBS.  Clinical Impression  Patient presents close to functional baseline.  Wife does relate at times difficulty in the morning getting OOB, but performs unaided.  Discussed fall prevention techniques for home with patient and son and wife.  Feel no further skilled PT needs at this time.  Will sign off.    Follow Up Recommendations No PT follow up;Supervision for mobility/OOB    Equipment Recommendations  None recommended by PT    Recommendations for Other Services       Precautions / Restrictions Precautions Precautions: Fall      Mobility  Bed Mobility               General bed mobility comments: sitting in chair in family room on unit with son and wife drinking coffee  Transfers Overall transfer level: Modified independent Equipment used: None   Sit to Stand: Modified independent (Device/Increase time)         General transfer comment: pushes up from armrests appropriately  Ambulation/Gait Ambulation/Gait assistance: Supervision Ambulation Distance (Feet): 200 Feet Assistive device: None Gait Pattern/deviations: Step-through pattern;Wide base of support;Decreased stride length     General Gait Details: increased lateral excursion due to L leg length discrepancy  Stairs Stairs: Yes Stairs assistance: Supervision Stair Management: Step to pattern;Forwards;Two rails Number of Stairs: 4    Wheelchair Mobility    Modified Rankin (Stroke Patients Only)       Balance                                             Pertinent Vitals/Pain Pain Score: 3  Pain Location: left chest and platnar callus on L great toe Pain Descriptors /  Indicators: Sore Pain Intervention(s): Monitored during session;Limited activity within patient's tolerance    Home Living Family/patient expects to be discharged to:: Private residence Living Arrangements: Spouse/significant other Available Help at Discharge: Family;Available 24 hours/day Type of Home: House Home Access: Stairs to enter Entrance Stairs-Rails: Right Entrance Stairs-Number of Steps: 3 Home Layout: Two level;Laundry or work area in basement;Able to live on main level with bedroom/bathroom Home Equipment: [name] - single point      Prior Function Level of Independence: Independent with assistive device(s)               Hand Dominance   Dominant Hand: Right    Extremity/Trunk Assessment   Upper Extremity Assessment: Overall WFL for tasks assessed           Lower Extremity Assessment: RLE deficits/detail;LLE deficits/detail RLE Deficits / Details: WFL LLE Deficits / Details: WFL has 3" LLD compared to R     Communication   Communication: No difficulties  Cognition Arousal/Alertness: Awake/alert Behavior During Therapy: WFL for tasks assessed/performed Overall Cognitive Status: Within Functional Limits for tasks assessed (some word finding trouble)                      General Comments      Exercises        Assessment/Plan    PT Assessment Patent does not need any further PT services  PT  Diagnosis Difficulty walking   PT Problem List    PT Treatment Interventions     PT Goals (Current goals can be found in the Care Plan section) Acute Rehab PT Goals PT Goal Formulation: All assessment and education complete, DC therapy    Frequency     Barriers to discharge        Co-evaluation               End of Session   Activity Tolerance: Patient tolerated treatment well Patient left: in chair;with family/visitor present      Functional Assessment Tool Used: Clinical Judgement Functional Limitation: Self care Self Care  Current Status [id number]): At least 1 percent but less than 20 percent impaired, limited or restricted Self Care Goal Status [id number]): 0 percent impaired, limited or restricted Self Care Discharge Status [phone]): 0 percent impaired, limited or restricted    Time: 1522-1540 PT Time Calculation (min) (ACUTE ONLY): 18 min   Charges:   PT Evaluation $PT Eval Moderate Complexity: 1 Procedure     PT G Codes:   PT G-Codes **NOT FOR INPATIENT CLASS** Functional Assessment Tool Used: Clinical Judgement Functional Limitation: Self care Self Care Current Status [id number]): At least 1 percent but less than 20 percent impaired, limited or restricted Self Care Goal Status [id number]): 0 percent impaired, limited or restricted Self Care Discharge Status [phone]): 0 percent impaired, limited or restricted    Logan Griffith 08/07/2015, [place], [place] 08/07/2015

## 2015-08-07 NOTE — Progress Notes (Signed)
Awake, alert, conversant.  MAEW with good strength.  Doing well. 

## 2015-08-07 NOTE — Discharge Summary (Signed)
Physician Discharge Summary  Patient ID: Talion Lowrie MRN: [id number] DOB/AGE: 1941-03-03 75 y.o.  Admit date: 08/07/2015 Discharge date: 08/07/2015  Admission Diagnoses: Essential Tremor  Discharge Diagnoses: Essential Tremor, s/p Deep Brain Stimulator (DBS) lead and Implanted Pulse Generator(IPG) placement left chest. Active Problems:   * No active hospital problems. *   Discharged Condition: good  Hospital Course: Logan Griffith returned today for placement of DBS lead left side and IPG.  Following uncomplicated surgery, he recovered nicely.    Consults: None  Significant Diagnostic Studies: none  Treatments: surgery: DBS lead and IPG placement (left)  Discharge Exam: Blood pressure 134/59, pulse 62, temperature 97 F (36.1 C), temperature source Oral, height 6\' 1"  (1.854 m), weight 122.471 kg (270 lb), SpO2 98 %. Opens eyes to voice, conversant. MAEW. Drsg left scalp, left chest intact without drainage. No swelling or bruising.    Disposition: 01-Home or Self Care He will follow up in office in two weeks for staple removal and IPG site inspection.  He will follow up with Dr. Carles Collet as scheduled for activation of his DBS therapy.      Medication List    ASK your doctor about these medications        amLODipine 10 MG tablet  Commonly known as:  NORVASC  Take 10 mg by mouth daily.     aspirin 81 MG tablet  Take 81 mg by mouth daily.     atorvastatin 40 MG tablet  Commonly known as:  LIPITOR  Take 40 mg by mouth daily.     beta carotene w/minerals tablet  Take 1 tablet by mouth daily.     cephALEXin 500 MG capsule  Commonly known as:  KEFLEX  Take 500 mg by mouth 3 (three) times daily.     CINNAMON PO  Take by mouth.     clotrimazole 1 % cream  Commonly known as:  LOTRIMIN  Apply 1 application topically 2 (two) times daily.     finasteride 5 MG tablet  Commonly known as:  PROSCAR  Take 5 mg by mouth daily.     Flaxseed Oil 1000 MG Caps  Take 1,000 mg by mouth  daily.     gabapentin 600 MG tablet  Commonly known as:  NEURONTIN  Take 600 mg by mouth 2 (two) times daily.     GARLIC PO  Take by mouth.     gemfibrozil 600 MG tablet  Commonly known as:  LOPID  Take 600 mg by mouth 2 (two) times daily before a meal.     glimepiride 4 MG tablet  Commonly known as:  AMARYL  Take 4 mg by mouth daily with breakfast.     HYDROcodone-acetaminophen 5-325 MG tablet  Commonly known as:  NORCO/VICODIN  Take 1-2 tablets by mouth every 4 (four) hours as needed (mild pain).     JARDIANCE 25 MG Tabs tablet  Generic drug:  empagliflozin  Take 25 mg by mouth daily.     labetalol 300 MG tablet  Commonly known as:  NORMODYNE  Take 300 mg by mouth 2 (two) times daily.     losartan-hydrochlorothiazide 100-25 MG tablet  Commonly known as:  HYZAAR  Take 1 tablet by mouth daily.     multivitamin tablet  Take 1 tablet by mouth daily.     oxybutynin 5 MG tablet  Commonly known as:  DITROPAN  Take 5 mg by mouth 3 (three) times daily.     pimecrolimus 1 % cream  Commonly known  as:  ELIDEL  Apply 1 application topically daily as needed (for psoriasis).     Potassium 99 MG Tabs  Take 99 mg by mouth daily.     primidone 250 MG tablet  Commonly known as:  MYSOLINE  Take 250 mg by mouth at bedtime.     PROBIOTIC PO  Take by mouth.     sertraline 100 MG tablet  Commonly known as:  ZOLOFT  Take 100 mg by mouth daily.     tamsulosin 0.4 MG Caps capsule  Commonly known as:  FLOMAX  Take 0.4 mg by mouth.     TOUJEO SOLOSTAR 300 UNIT/ML Sopn  Generic drug:  Insulin Glargine  Inject 55 Units into the skin at bedtime.     TRADJENTA 5 MG Tabs tablet  Generic drug:  linagliptin  Take 5 mg by mouth daily.     vitamin B-12 1000 MCG tablet  Commonly known as:  CYANOCOBALAMIN  Take 1,000 mcg by mouth daily.     Vitamin D3 3000 units Tabs  Take 3,000 Units by mouth daily.           Follow-up Information    Follow up with Peggyann Shoals, MD.    Specialty:  Neurosurgery   Contact information:   [address] [place] [address] [place] [address] [phone]       Signed: Verdis Prime 08/07/2015, 9:13 AM

## 2015-08-07 NOTE — Transfer of Care (Signed)
Immediate Anesthesia Transfer of Care Note  Patient: Logan Griffith  Procedure(s) Performed: Procedure(s) with comments: UNILATERAL PULSE GENERATOR IMPLANT (LEFT) (Left) - UNILATERAL PULSE GENERATOR IMPLANT (LEFT)  Patient Location: PACU  Anesthesia Type:General  Level of Consciousness: awake, alert , oriented and patient cooperative  Airway & Oxygen Therapy: Patient Spontanous Breathing and Patient connected to nasal cannula oxygen  Post-op Assessment: Report given to RN and Post -op Vital signs reviewed and stable  Post vital signs: Reviewed and stable  Last Vitals:  Filed Vitals:   08/07/15 0613  BP: 134/59  Pulse: 62  Temp: [id number] C    Complications: No apparent anesthesia complications

## 2015-08-07 NOTE — Discharge Summary (Signed)
Physician Discharge Summary  Patient ID: Logan Griffith MRN: [id number] DOB/AGE: 1941/05/05 75 y.o.  Admit date: 08/07/2015 Discharge date: 08/07/2015  Admission Diagnoses:Essential Tremor  Discharge Diagnoses: Essential Tremor Active Problems:   Essential tremor   Discharged Condition: good  Hospital Course: Patient underwent placement of implantable pulse generator and extensions for planned Stage II DBS electrode placement for Essential Tremor (unilateral Left VIM [place])  Consults: None  Significant Diagnostic Studies: None  Treatments: surgery:  placement of implantable pulse generator and extensions for planned Stage II DBS electrode placement for Essential Tremor (unilateral Left VIM [place])  Discharge Exam: Blood pressure 140/71, pulse 82, temperature 97.5 F (36.4 C), temperature source Oral, height 6\' 1"  (1.854 m), weight 122.471 kg (270 lb), SpO2 91 %. Neurologic: Alert and oriented X 3, normal strength and tone. Normal symmetric reflexes. Normal coordination and gait Wound:CDI  Disposition: Home     Medication List    ASK your doctor about these medications        amLODipine 10 MG tablet  Commonly known as:  NORVASC  Take 10 mg by mouth daily.     aspirin 81 MG tablet  Take 81 mg by mouth daily.     atorvastatin 40 MG tablet  Commonly known as:  LIPITOR  Take 40 mg by mouth daily.     beta carotene w/minerals tablet  Take 1 tablet by mouth daily.     cephALEXin 500 MG capsule  Commonly known as:  KEFLEX  Take 500 mg by mouth 3 (three) times daily.     CINNAMON PO  Take by mouth.     clotrimazole 1 % cream  Commonly known as:  LOTRIMIN  Apply 1 application topically 2 (two) times daily.     finasteride 5 MG tablet  Commonly known as:  PROSCAR  Take 5 mg by mouth daily.     Flaxseed Oil 1000 MG Caps  Take 1,000 mg by mouth daily.     gabapentin 600 MG tablet  Commonly known as:  NEURONTIN  Take 600 mg by mouth 2 (two) times daily.      GARLIC PO  Take by mouth.     gemfibrozil 600 MG tablet  Commonly known as:  LOPID  Take 600 mg by mouth 2 (two) times daily before a meal.     glimepiride 4 MG tablet  Commonly known as:  AMARYL  Take 4 mg by mouth daily with breakfast.     HYDROcodone-acetaminophen 5-325 MG tablet  Commonly known as:  NORCO/VICODIN  Take 1-2 tablets by mouth every 4 (four) hours as needed (mild pain).     JARDIANCE 25 MG Tabs tablet  Generic drug:  empagliflozin  Take 25 mg by mouth daily.     labetalol 300 MG tablet  Commonly known as:  NORMODYNE  Take 300 mg by mouth 2 (two) times daily.     losartan-hydrochlorothiazide 100-25 MG tablet  Commonly known as:  HYZAAR  Take 1 tablet by mouth daily.     multivitamin tablet  Take 1 tablet by mouth daily.     oxybutynin 5 MG tablet  Commonly known as:  DITROPAN  Take 5 mg by mouth 3 (three) times daily.     pimecrolimus 1 % cream  Commonly known as:  ELIDEL  Apply 1 application topically daily as needed (for psoriasis).     Potassium 99 MG Tabs  Take 99 mg by mouth daily.     primidone 250 MG tablet  Commonly known  as:  MYSOLINE  Take 250 mg by mouth at bedtime.     PROBIOTIC PO  Take by mouth.     sertraline 100 MG tablet  Commonly known as:  ZOLOFT  Take 100 mg by mouth daily.     tamsulosin 0.4 MG Caps capsule  Commonly known as:  FLOMAX  Take 0.4 mg by mouth.     TOUJEO SOLOSTAR 300 UNIT/ML Sopn  Generic drug:  Insulin Glargine  Inject 55 Units into the skin at bedtime.     TRADJENTA 5 MG Tabs tablet  Generic drug:  linagliptin  Take 5 mg by mouth daily.     vitamin B-12 1000 MCG tablet  Commonly known as:  CYANOCOBALAMIN  Take 1,000 mcg by mouth daily.     Vitamin D3 3000 units Tabs  Take 3,000 Units by mouth daily.           Follow-up Information    Follow up with Peggyann Shoals, MD.   Specialty:  Neurosurgery   Contact information:   [address] [place] [address] [place]  [address] [phone]       Signed: Peggyann Shoals, MD 08/07/2015, 9:43 AM

## 2015-08-07 NOTE — H&P (View-Only) (Signed)
Patient ID:   [id number] Patient: Logan Griffith  Date of Birth: 1941-04-12 Visit Type: Office Visit   Date: 05/18/2015 02:30 PM Provider: Marchia Meiers. Vertell Limber MD   This 75 year old male presents for Tremor.  History of Present Illness: 1.  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  Patient recalls five years symptoms right greater than left hand tremor.  History: IDDM, HTN, essential tremor Surgical history: Left ankle 3, left hand, circumcision 2014  MRI on [facility]  The patient complains of right greater than left hand tremor.  He says it is very difficult for him to do his activities of daily living and things such as eating are becoming increasingly difficult for him.  He says he cannot poor water and when he attempted to do so he poured it all over himself and Dr. Carles Collet.  The patient has had a fusion of his left ankle.  He denies problems with rigidity.  He was not felt to have any signs of parkinsonism.  Dr. Carles Collet has recommended left VIM thalamus DBS for predominantly right-sided tremor.  She has recommended a unilateral surgery.        PAST MEDICAL/SURGICAL HISTORY   (Detailed)  Disease/disorder Onset Date Management Date Comments    Ankle surgery (3)      Hand surgery      Circumsion 2014   Arthritis      Depression      Diabetes mellitus      High cholesterol      Hypertension         PAST MEDICAL HISTORY, SURGICAL HISTORY, FAMILY HISTORY, SOCIAL HISTORY AND REVIEW OF SYSTEMS I have reviewed the patient's past medical, surgical, family and social history as well as the comprehensive review of systems as included on the [facility] history form dated 05/18/2015, which I have signed.  Family History  (Detailed) Relationship Family Member Name Deceased Age at Death Condition Onset Age Cause of Death      Family history of Diabetes mellitus  N      Family  history of Hypertension  N    SOCIAL HISTORY  (Detailed) Tobacco use reviewed. Preferred language is Unknown.   Smoking status: Never smoker.  SMOKING STATUS Use Status Type Smoking Status Usage Per Day Years Used Total Pack Years  no/never  Never smoker             MEDICATIONS(added, continued or stopped this visit): Started Medication Directions Instruction Stopped   amlodipine 10 mg tablet take 1 tablet by oral route  every day     aspirin 81 mg chewable tablet chew 1 tablet by oral route  every day     Centrum Silver Take once daily     Cinnamon 500 mg capsule Take as directed     flaxseed oil 1,000 mg capsule Take once daily     gabapentin 600 mg tablet take 1 tablet by oral route 2 times every day     garlic Take as directed     gemfibrozil 600 mg tablet Take as directed     glimepiride 4 mg tablet Take as directed     hydrocodone-acetaminophen take 1 - 2 tablets by oral route  every 6 hours as needed for pain     Jardiance 25 mg tablet Take as directed     labetalol 300 mg tablet take 1 tablet by oral route 2 times every day  Lipitor 40 mg tablet Take as directed     losartan 100 mg-hydrochlorothiazide 25 mg tablet take 1 tablet by oral route  every day     MSM 1,000 mg tablet Take as directed     Ocuvite Lutein and Zeaxanthin Take once daily     oxybutynin chloride 5 mg tablet Take as directed     potassium 99 mg tablet Take as directed     primidone 250 mg tablet Take as directed     probiotic Take as directed     sertraline 100 mg tablet Take as directed     tamsulosin 0.4 mg capsule Take as directed     Toujeo SoloStar 300 unit/mL (1.5 mL) subcutaneous insulin pen Use as directed     Tradjenta 5 mg tablet Take as directed     Vitamin B-12 Take as directed     Vitamin D3 Take as directed       ALLERGIES: Ingredient Reaction Medication Name Comment  NO KNOWN ALLERGIES     No known allergies.    Vitals Date Temp F BP Pulse Ht In Wt Lb BMI BSA Pain  Score  05/18/2015  160/72 85 73 274 36.15  0/10     PHYSICAL EXAM General Level of Distress: no acute distress Overall Appearance: normal  Head and Face  Right Left  Fundoscopic Exam:  normal normal    Cardiovascular Cardiac: regular rate and rhythm without murmur  Right Left  Carotid Pulses: normal normal  Respiratory Lungs: clear to auscultation  Neurological Orientation: normal Recent and Remote Memory: normal Attention Span and Concentration:   normal Language: normal Fund of Knowledge: normal  Right Left Sensation: normal normal Upper Extremity Coordination: normal normal  Lower Extremity Coordination: normal normal  Musculoskeletal Gait and Station: normal  Right Left Upper Extremity Muscle Strength: normal normal Lower Extremity Muscle Strength: normal normal Upper Extremity Muscle Tone:  tremor tremor Lower Extremity Muscle Tone: normal normal  Motor Strength Upper and lower extremity motor strength was tested in the clinically pertinent muscles.     Deep Tendon Reflexes  Right Left Biceps: normal normal Triceps: normal normal Brachiloradialis: normal normal Patellar: normal normal Achilles: normal normal  Cranial Nerves II. Optic Nerve/Visual Fields: normal III. Oculomotor: normal IV. Trochlear: normal V. Trigeminal: normal VI. Abducens: normal VII. Facial: normal VIII. Acoustic/Vestibular: normal IX. Glossopharyngeal: normal X. Vagus: normal XI. Spinal Accessory: normal XII. Hypoglossal: normal  Motor and other Tests Lhermittes: negative Rhomberg: negative Pronator drift: absent     Right Left Hoffman's: normal normal Clonus: normal normal Babinski: normal normal   Additional Findings:  The patient has intention tremor right hand greater than left.  He has no rest tremor.  He has no rigidity.  He has some mild titubation of his head.    IMPRESSION The patient has right greater than left-sided tremor.  He is having  difficulty using his right hand for activities such as eating and writing.  Dr. Carles Collet has recommended proceeding with unilateral DBS.  I went over the specifics of this surgery in detail with the patient and his family.  I answered their questions.  We discussed the use of Star fix and the operation was to place the electrode and then the subsequent operation to place the implantable pulse generator.  Completed Orders (this encounter) Order Details Reason Side Interpretation Result Initial Treatment Date Region  Hypertension education Follow up with primary care physician.        Lifestyle education regarding diet Encouraged  to eat a well balanced diet and follow up with primary care physician.         Assessment/Plan # Detail Type Description   1. Assessment Essential tremor (G25.0).       2. Assessment Essential (primary) hypertension (I10).       3. Assessment Body mass index (BMI) 36.0-36.9, adult (Z68.36).   Plan Orders Today's instructions / counseling include(s) Lifestyle education regarding diet.         Pain Assessment/Treatment Pain Scale: 0/10. Method: Numeric Pain Intensity Scale.  The patient wishes to proceed with DBS surgery.  This will be scheduled after February 11 because of family scheduling concerns.  He wants his daughter to be present and therefore wants to wait until after she is available to come at that time.  The risks and benefits were discussed in detail with the patient and his family and they wished to proceed.  Orders: Instruction(s)/Education: Assessment Instruction  I10 Hypertension education  [id number] Lifestyle education regarding diet             Provider:  Marchia Meiers. Vertell Limber MD  05/23/2015 03:19 PM Dictation edited by: Marchia Meiers. Vertell Limber    CC Providers: Wells Guiles Tat [address] [place], [place] [address]              Electronically signed by Marchia Meiers Vertell Limber MD on 05/23/2015 03:19 PM

## 2015-08-07 NOTE — Anesthesia Preprocedure Evaluation (Addendum)
Anesthesia Evaluation  Patient identified by MRN, date of birth, ID band Patient awake    Reviewed: Allergy & Precautions, NPO status , Patient's Chart, lab work & pertinent test results  Airway Mallampati: II  TM Distance: >3 FB Neck ROM: Full    Dental  (+) Teeth Intact   Pulmonary sleep apnea , former smoker,    breath sounds clear to auscultation       Cardiovascular hypertension, Pt. on medications  Rhythm:Regular Rate:Normal     Neuro/Psych PSYCHIATRIC DISORDERS Depression  Neuromuscular disease    GI/Hepatic negative GI ROS, Neg liver ROS,   Endo/Other  diabetes, Type 2, Oral Hypoglycemic Agents  Renal/GU negative Renal ROS  negative genitourinary   Musculoskeletal  (+) Arthritis ,   Abdominal   Peds negative pediatric ROS (+)  Hematology negative hematology ROS (+)   Anesthesia Other Findings - HLD - Peripheral Neuropathy - BPH -   Reproductive/Obstetrics                            Lab Results  Component Value Date   WBC 5.7 08/07/2015   HGB 12.9* 08/07/2015   HCT 37.9* 08/07/2015   MCV 100.5* 08/07/2015   PLT 189 08/07/2015   Lab Results  Component Value Date   CREATININE 1.59* 07/31/2015   BUN 35* 07/31/2015   NA 139 07/31/2015   K 4.0 07/31/2015   CL 107 07/31/2015   CO2 21* 07/31/2015   No results found for: INR, PROTIME  07/2015 EKG: sinus bradycardia.    Anesthesia Physical Anesthesia Plan  ASA: III  Anesthesia Plan: General   Post-op Pain Management:    Induction: Intravenous  Airway Management Planned: Oral ETT  Additional Equipment:   Intra-op Plan:   Post-operative Plan: Extubation in OR  Informed Consent: I have reviewed the patients History and Physical, chart, labs and discussed the procedure including the risks, benefits and alternatives for the proposed anesthesia with the patient or authorized representative who has indicated his/her  understanding and acceptance.   Dental advisory given  Plan Discussed with:   Anesthesia Plan Comments:         Anesthesia Quick Evaluation

## 2015-08-08 DIAGNOSIS — G250 Essential tremor: Secondary | ICD-10-CM | POA: Diagnosis not present

## 2015-08-08 LAB — HEMOGLOBIN A1C
HEMOGLOBIN A1C: 7.5 % — AB (ref 4.8–5.6)
MEAN PLASMA GLUCOSE: 169 mg/dL

## 2015-08-08 LAB — GLUCOSE, CAPILLARY
GLUCOSE-CAPILLARY: 116 mg/dL — AB (ref 65–99)
GLUCOSE-CAPILLARY: 184 mg/dL — AB (ref 65–99)

## 2015-08-08 MED ORDER — HYDROCODONE-ACETAMINOPHEN 5-325 MG PO TABS: 1.0000 | ORAL_TABLET | Freq: Four times a day (QID) | ORAL | Status: DC | PRN

## 2015-08-08 MED ORDER — LIDOCAINE HCL 2 % EX GEL
1.0000 | Freq: Once | CUTANEOUS | Status: AC
Start: 2015-08-08 — End: 2015-08-08
  Administered 2015-08-08: 1 via URETHRAL
  Filled 2015-08-08 (×3): qty 5

## 2015-08-08 NOTE — Progress Notes (Addendum)
Discharge reviewed with patient, spouse and son. Hard script given to patient and spouse.  Patient and spouse were thought how to empty foley bag, they both demonstrated emptying bag as staff observed and were both successful.

## 2015-08-08 NOTE — Discharge Summary (Signed)
Physician Discharge Summary  Patient ID: Logan Griffith MRN: [id number] DOB/AGE: 1940-11-12 75 y.o.  Admit date: 08/07/2015 Discharge date: 08/08/2015  Admission Diagnoses:essential tremor  Discharge Diagnoses: essential tremor, urinary retention Active Problems:   Essential tremor   Discharged Condition: good  Hospital Course: Mr. Leese was admitted and taken to the operating room for implantation of his power source for the DBS placed last week. His wounds are clean, dry, and without signs of infection. He was unable to void, and was sent home with a foley and leg bag. He was instructed by the nursing staff on foley care.  Consults: None  Significant Diagnostic Studies: none  Treatments: surgery: UNILATERAL PULSE GENERATOR IMPLANT (LEFT) (Left) - UNILATERAL PULSE GENERATOR IMPLANT (LEFT) with extensions   Discharge Exam: Blood pressure 123/58, pulse 62, temperature 97.4 F (36.3 C), temperature source Oral, resp. rate 18, height 6\' 1"  (1.854 m), weight 270 lb (122.471 kg), SpO2 93 %. General appearance: alert, cooperative and appears stated age Neurologic: Mental status: Alert, oriented, thought content appropriate Cranial nerves: normal Motor: moving all extremities  Disposition: 01-Home or Self Care     Medication List    TAKE these medications        amLODipine 10 MG tablet  Commonly known as:  NORVASC  Take 10 mg by mouth daily.     aspirin 81 MG tablet  Take 81 mg by mouth daily.     atorvastatin 40 MG tablet  Commonly known as:  LIPITOR  Take 40 mg by mouth daily.     beta carotene w/minerals tablet  Take 1 tablet by mouth daily.     cephALEXin 500 MG capsule  Commonly known as:  KEFLEX  Take 500 mg by mouth 3 (three) times daily.     CINNAMON PO  Take by mouth.     clotrimazole 1 % cream  Commonly known as:  LOTRIMIN  Apply 1 application topically 2 (two) times daily.     finasteride 5 MG tablet  Commonly known as:  PROSCAR  Take 5 mg by mouth  daily.     Flaxseed Oil 1000 MG Caps  Take 1,000 mg by mouth daily.     gabapentin 600 MG tablet  Commonly known as:  NEURONTIN  Take 600 mg by mouth 2 (two) times daily.     GARLIC PO  Take by mouth.     gemfibrozil 600 MG tablet  Commonly known as:  LOPID  Take 600 mg by mouth 2 (two) times daily before a meal.     glimepiride 4 MG tablet  Commonly known as:  AMARYL  Take 4 mg by mouth daily with breakfast.     HYDROcodone-acetaminophen 5-325 MG tablet  Commonly known as:  NORCO/VICODIN  Take 1-2 tablets by mouth every 4 (four) hours as needed (mild pain).     HYDROcodone-acetaminophen 5-325 MG tablet  Commonly known as:  NORCO/VICODIN  Take 1 tablet by mouth every 6 (six) hours as needed for moderate pain.     JARDIANCE 25 MG Tabs tablet  Generic drug:  empagliflozin  Take 25 mg by mouth daily.     labetalol 300 MG tablet  Commonly known as:  NORMODYNE  Take 300 mg by mouth 2 (two) times daily.     losartan-hydrochlorothiazide 100-25 MG tablet  Commonly known as:  HYZAAR  Take 1 tablet by mouth daily.     multivitamin tablet  Take 1 tablet by mouth daily.     oxybutynin 5 MG tablet  Commonly known as:  DITROPAN  Take 5 mg by mouth 3 (three) times daily.     pimecrolimus 1 % cream  Commonly known as:  ELIDEL  Apply 1 application topically daily as needed (for psoriasis).     Potassium 99 MG Tabs  Take 99 mg by mouth daily.     primidone 250 MG tablet  Commonly known as:  MYSOLINE  Take 250 mg by mouth at bedtime.     PROBIOTIC PO  Take by mouth.     sertraline 100 MG tablet  Commonly known as:  ZOLOFT  Take 100 mg by mouth daily.     tamsulosin 0.4 MG Caps capsule  Commonly known as:  FLOMAX  Take 0.4 mg by mouth.     TOUJEO SOLOSTAR 300 UNIT/ML Sopn  Generic drug:  Insulin Glargine  Inject 55 Units into the skin at bedtime.     TRADJENTA 5 MG Tabs tablet  Generic drug:  linagliptin  Take 5 mg by mouth daily.     vitamin B-12 1000 MCG  tablet  Commonly known as:  CYANOCOBALAMIN  Take 1,000 mcg by mouth daily.     Vitamin D3 3000 units Tabs  Take 3,000 Units by mouth daily.           Follow-up Information    Follow up with Peggyann Shoals, MD.   Specialty:  Neurosurgery   Contact information:   [address] [place] [address] [phone]       Signed: Winfield Cunas 08/08/2015, 4:42 PM

## 2015-08-08 NOTE — Progress Notes (Addendum)
Patient voided 120 ml since cath. Removal at 0600 today; bladder scan reveals greater then 800 ml residual urine; MD paged to place foley and leg bag for home use; 16 F foley cath. Placed using sterile technique with 2% lidocaine jelly per MD order; patient c/o burning discomfort from last foley cath.  Foley placed without difficulty; clear yellow urine returned with 995 residual urine; post bladder scan was 30 ml.  Will educate on emptying foley bag; foley cath. Cleaning and to follow up with urology.

## 2015-08-08 NOTE — Progress Notes (Signed)
   Subjective:    Patient ID: Logan Griffith, male    DOB: Sep 03, 1940, 75 y.o.   MRN: [id number]  HPI parkinsons Unable to void this AM, nor was he able to void yesterday post op. He will try again this am, but does understand that he can continue to try as an inpatient, or be discharged with a foley and leg bag.  Wounds clean, dry, no signs of infection. Ambulating well    Review of Systems     Objective:   Physical Exam        Assessment & Plan:

## 2015-08-08 NOTE — Progress Notes (Signed)
OT Cancellation Note  Patient Details Name: Logan Griffith MRN: [id number] DOB: 09-01-1940   Cancelled Treatment:    Reason Eval/Treat Not Completed: OT screened, no needs identified, will sign off. Pt and pt's wife report they feel comfortable performing all ADLs and IADLs together. Pt completing ADLs at supervision-mod I level in room already. Pt declined needing OT at this time. Please re-consult if needs or situation changes.  Redmond Baseman, OTR/L Pager: [phone] 08/08/2015, 8:34 AM

## 2015-08-10 ENCOUNTER — Encounter (HOSPITAL_COMMUNITY): Payer: Self-pay | Admitting: Neurosurgery

## 2015-08-11 ENCOUNTER — Encounter (HOSPITAL_COMMUNITY): Payer: Self-pay | Admitting: Neurosurgery

## 2015-08-25 ENCOUNTER — Encounter (HOSPITAL_COMMUNITY): Payer: Self-pay | Admitting: Neurosurgery

## 2015-09-04 ENCOUNTER — Ambulatory Visit (INDEPENDENT_AMBULATORY_CARE_PROVIDER_SITE_OTHER): Payer: Medicare Other | Admitting: Neurology

## 2015-09-04 ENCOUNTER — Encounter: Payer: Self-pay | Admitting: Neurology

## 2015-09-04 VITALS — BP 122/68 | HR 74 | Ht 73.0 in | Wt 260.0 lb

## 2015-09-04 DIAGNOSIS — Z9689 Presence of other specified functional implants: Secondary | ICD-10-CM

## 2015-09-04 DIAGNOSIS — G250 Essential tremor: Secondary | ICD-10-CM | POA: Diagnosis not present

## 2015-09-04 DIAGNOSIS — E1142 Type 2 diabetes mellitus with diabetic polyneuropathy: Secondary | ICD-10-CM

## 2015-09-04 NOTE — Progress Notes (Signed)
Subjective:   Logan Griffith was seen in consultation in the movement disorder clinic at the request of [facility].  The evaluation is for tremor.  The patient is seen in consultation at the request of Dr. Jannifer Franklin.  I have reviewed her notes and appreciate those.  This patient is accompanied in the office by his spouse and child who supplement the history.  The patient has had essential tremor for approximately 5 years according to records.   It has been worse over the last 2 years.  It is in both hands.  He notices it most in the R hand but he is R hand dominant.  He has trouble writing checks and eating sounds.  The patient is on primidone, 250 mg daily.  He has never been on a higher dosage than this but he indicates that it is not helpful.  The patient states he is on labetalol, 300 mg twice a day.  He was told that he could take a higher dosage of this to see if that helped his tremor, but he has not done that.  Pt states that the labetolol is for BP, however.   He has noticed no benefit with the primidone.  Unfortunately, these medications are not helping.  He does have a history of kidney stones, and therefore is not able to have Topamax.  There is no family hx of tremor.  He comes in today looking for an opinion regarding DBS therapy.   Affected by caffeine:  No. (1-2 cups coffee/day - 18 oz each; 1-2 diet soda's a day) Affected by alcohol:  No. Affected by stress:  No. Affected by fatigue:  No. Spills soup if on spoon:  Yes.   Spills glass of liquid if full:  Yes.   Affects ADL's (tying shoes, brushing teeth, etc):  Yes.   (has to shave with 2 hands with blade)  Current/Previously tried tremor medications: primidone; beta blocker for BP  Outside reports reviewed: historical medical records and office notes.  04/07/15 update:  The patient returns today, accompanied by his wife and son who supplement the history.  The patient had neuropsych testing done by Dr. Richrd Sox on  03/18/2015.  She felt that he would be a good DBS candidate.  No evidence of dementia.  The patient remains on primidone, 250 mg twice a day for his tremor.  He is also on labetalol 300 mg twice a day but that is primarily for his blood pressure.  He has a history of nephrolithiasis and cannot take Topamax.  He remains interested in DBS therapy.  06/18/15 update: The patient follows up with me today, and final preparation for his DBS surgery.  It is scheduled for 07/30/2014.  He remains on primidone, 250 mg but is down to once a day.  He has seen Dr. Vertell Limber and reviewed logistics of surgery with him.  He expresses desire to proceed with DBS therapy.  09/04/15 update:  The patient is following up today, accompanied by his wife, son, daughter in law who supplement the history.  The patient is status post DBS surgery to the left VIM on 07/31/2015.  He had his generator placed on 08/07/2015.  He did have difficulty voiding after the surgery and did have to leave the hospital with a Foley in place.  He has had some nausea following surgery.    No Known Allergies  Outpatient Encounter Prescriptions as of 09/04/2015  Medication Sig  . amLODipine (NORVASC) 10 MG tablet Take 10  mg by mouth daily.  Marland Kitchen aspirin 81 MG tablet Take 81 mg by mouth daily.  Marland Kitchen atorvastatin (LIPITOR) 40 MG tablet Take 40 mg by mouth daily.  . beta carotene w/minerals (OCUVITE) tablet Take 1 tablet by mouth daily.  . clotrimazole (LOTRIMIN) 1 % cream Apply 1 application topically 2 (two) times daily.  . empagliflozin (JARDIANCE) 25 MG TABS tablet Take 25 mg by mouth daily.  . finasteride (PROSCAR) 5 MG tablet Take 5 mg by mouth daily.   Marland Kitchen gabapentin (NEURONTIN) 600 MG tablet Take 600 mg by mouth 2 (two) times daily.  Marland Kitchen gemfibrozil (LOPID) 600 MG tablet Take 600 mg by mouth 2 (two) times daily before a meal.  . glimepiride (AMARYL) 4 MG tablet Take 4 mg by mouth daily with breakfast.  . HYDROcodone-acetaminophen (NORCO/VICODIN) 5-325 MG  tablet Take 1-2 tablets by mouth every 4 (four) hours as needed (mild pain).  Marland Kitchen labetalol (NORMODYNE) 300 MG tablet Take 300 mg by mouth 2 (two) times daily.  Marland Kitchen linagliptin (TRADJENTA) 5 MG TABS tablet Take 5 mg by mouth daily.  Marland Kitchen losartan-hydrochlorothiazide (HYZAAR) 100-25 MG per tablet Take 1 tablet by mouth daily.  Marland Kitchen oxybutynin (DITROPAN) 5 MG tablet Take 5 mg by mouth 3 (three) times daily.  . pimecrolimus (ELIDEL) 1 % cream Apply 1 application topically daily as needed (for psoriasis).  . Potassium 99 MG TABS Take 99 mg by mouth daily.   . sertraline (ZOLOFT) 100 MG tablet Take 100 mg by mouth daily.  . tamsulosin (FLOMAX) 0.4 MG CAPS capsule Take 0.4 mg by mouth.  Nelva Nay SOLOSTAR 300 UNIT/ML SOPN Inject 55 Units into the skin at bedtime.   . vitamin B-12 (CYANOCOBALAMIN) 1000 MCG tablet Take 1,000 mcg by mouth daily.  . [DISCONTINUED] cephALEXin (KEFLEX) 500 MG capsule Take 500 mg by mouth 3 (three) times daily.  . [DISCONTINUED] Cholecalciferol (VITAMIN D3) 3000 UNITS TABS Take 3,000 Units by mouth daily.   . [DISCONTINUED] CINNAMON PO Take by mouth.  . [DISCONTINUED] Flaxseed, Linseed, (FLAXSEED OIL) 1000 MG CAPS Take 1,000 mg by mouth daily.   . [DISCONTINUED] GARLIC PO Take by mouth.  . [DISCONTINUED] HYDROcodone-acetaminophen (NORCO/VICODIN) 5-325 MG tablet Take 1 tablet by mouth every 6 (six) hours as needed for moderate pain.  . [DISCONTINUED] Multiple Vitamin (MULTIVITAMIN) tablet Take 1 tablet by mouth daily.  . [DISCONTINUED] primidone (MYSOLINE) 250 MG tablet Take 250 mg by mouth at bedtime.  . [DISCONTINUED] Probiotic Product (PROBIOTIC PO) Take by mouth.   No facility-administered encounter medications on file as of 09/04/2015.    Past Medical History  Diagnosis Date  . Hypertension   . Peripheral neuropathy ([place])   . Tremor   . Depression   . Prostate disorder   . Back pain   . History of kidney stones   . Diabetes ([place])     Type II  . Hyperlipemia   .  Arthritis   . Sleep apnea   . Hemorrhoids     external  . BPH (benign prostatic hyperplasia)   . Nocturia   . Psoriasis     Past Surgical History  Procedure Laterality Date  . Ankle arthroscopy Left     x 2  . Circumcision    . Thumb surgery Left     ligament  . Ankle fusion Left   . Colonoscopy    . Eye surgery Bilateral     blepharoplasty  . Carpal tunnel release N/A 07/24/2015    Procedure: Fiducial placement for  deep brain stimulator;  Surgeon: Erline Levine, MD;  Location: [facility];  Service: Neurosurgery;  Laterality: N/A;  Fiducial placement for deep brain stimulator  . Pulse generator implant Left 08/07/2015    Procedure: UNILATERAL PULSE GENERATOR IMPLANT (LEFT);  Surgeon: Erline Levine, MD;  Location: [facility];  Service: Neurosurgery;  Laterality: Left;  UNILATERAL PULSE GENERATOR IMPLANT (LEFT)  . Subthalamic stimulator insertion Left 07/31/2015    Procedure: LEFT DEEP BRAIN STIMULATOR INSERTION;  Surgeon: Erline Levine, MD;  Location: [place] [facility];  Service: Neurosurgery;  Laterality: Left;  LEFT DEEP BRAIN STIMULATOR INSERTION    Social History   Social History  . Marital Status: Married    Spouse Name: N/A  . Number of Children: N/A  . Years of Education: N/A   Occupational History  . Not on file.   Social History Main Topics  . Smoking status: Former Smoker -- 24 years    Quit date: 06/06/1982  . Smokeless tobacco: Not on file  . Alcohol Use: 0.0 oz/week    0 Standard drinks or equivalent per week     Comment: ocassional  . Drug Use: No  . Sexual Activity: Not on file   Other Topics Concern  . Not on file   Social History Narrative    Family Status  Relation Status Death Age  . Mother Deceased     HTN  . Father Deceased     HTN  . Brother Alive     Review of Systems  A complete 10 system ROS was obtained and was negative apart from what is mentioned.   Objective:   VITALS:   Filed Vitals:   09/04/15 1423  BP: 122/68  Pulse: 74    Height: 6\' 1"  (1.854 m)  Weight: 260 lb (117.935 kg)   Gen:  Appears stated age and in NAD. HEENT:  Normocephalic, atraumatic. The mucous membranes are moist. The superficial temporal arteries are without ropiness or tenderness. Cardiovascular: Regular rate and rhythm. Lungs: Clear to auscultation bilaterally. Neck: There are no carotid bruits noted bilaterally.  NEUROLOGICAL:  Orientation:  The patient is alert and oriented x 3.   Cranial nerves: There is good facial symmetry.  Speech is fluent and clear. Soft palate rises symmetrically and there is no tongue deviation. Hearing is intact to conversational tone. Tone: Tone is good throughout. Sensation: Sensation is intact to light touch throughout Coordination:  The patient has no dysdiadichokinesia or dysmetria. Motor: Strength is 5/5 in the bilateral upper and lower extremities.  Shoulder shrug is equal bilaterally.  There is no pronator drift.  There are no fasciculations noted. Gait and Station: The patient ambulates in a very antalgic fashion stating that he has a bad ankle  MOVEMENT EXAM: Tremor:  With the device off, there is still more mild tremor then prior to DBS therapy on the right.  The patient's device was activated today and this is described in more detail on a separate procedure programming note.  In brief, there was near complete resolution of the tremor.  Following activation of the device, the patient was able to drink a glass of water without spilling any of it.       Assessment/Plan:   1.  Essential Tremor.  -The patient is status post DBS surgery to the left VIM on 07/31/2015.  He had his generator placed on 08/07/2015.  -The patient's device was activated today on 09/04/2015. 2.  Diabetic peripheral neuropathy  -I do think that this the main  reason for loss of balance.  We discussed control of diabetes.  Has been drinking more soda and talked about changing to water.

## 2015-09-04 NOTE — Procedures (Signed)
DBS Programming was performed.    Total time spent programming was 55 minutes.  Device was initially off as this was a new start.  The device was turned on.  Soft start was turned on at 8.  Impedences were checked and were within normal limits.  Battery was checked and was determined to be functioning normally and not near the end of life.  Final settings were as follows:  Left brain electrode:     1-0+           ; Amplitude  2.1   V   ; Pulse width 90 microseconds;   Frequency   160   Hz.  Right brain electrode:     n/a  The patient and his family were shown how to use the patient programmer and they demonstrated proper use of the programmer.  They were given a patient DVD for this.

## 2015-09-11 ENCOUNTER — Encounter: Payer: Self-pay | Admitting: Neurology

## 2015-09-11 ENCOUNTER — Ambulatory Visit (INDEPENDENT_AMBULATORY_CARE_PROVIDER_SITE_OTHER): Payer: Medicare Other | Admitting: Neurology

## 2015-09-11 VITALS — BP 160/70 | HR 85 | Ht 73.0 in | Wt 260.0 lb

## 2015-09-11 DIAGNOSIS — G250 Essential tremor: Secondary | ICD-10-CM | POA: Diagnosis not present

## 2015-09-11 DIAGNOSIS — R112 Nausea with vomiting, unspecified: Secondary | ICD-10-CM | POA: Diagnosis not present

## 2015-09-11 DIAGNOSIS — Z9689 Presence of other specified functional implants: Secondary | ICD-10-CM | POA: Diagnosis not present

## 2015-09-11 NOTE — Progress Notes (Signed)
Subjective:   Logan Griffith was seen in consultation in Logan movement disorder clinic at Logan request of [facility].  Logan evaluation is for tremor.  Logan Griffith is seen in consultation at Logan request of Dr. Jannifer Franklin.  I have reviewed her notes and appreciate those.  This Griffith is accompanied in Logan office by his spouse and child who supplement Logan history.  Logan Griffith has had essential tremor for approximately 5 years according to records.   It has been worse over Logan last 2 years.  It is in both hands.  He notices it most in Logan R hand but he is R hand dominant.  He has trouble writing checks and eating sounds.  Logan Griffith is on primidone, 250 mg daily.  He has never been on a higher dosage than this but he indicates that it is not helpful.  Logan Griffith states he is on labetalol, 300 mg twice a day.  He was told that he could take a higher dosage of this to see if that helped his tremor, but he has not done that.  Pt states that Logan labetolol is for BP, however.   He has noticed no benefit with Logan primidone.  Unfortunately, these medications are not helping.  He does have a history of kidney stones, and therefore is not able to have Topamax.  There is no family hx of tremor.  He comes in today looking for an opinion regarding DBS therapy.   Affected by caffeine:  No. (1-2 cups coffee/day - 18 oz each; 1-2 diet soda's a day) Affected by alcohol:  No. Affected by stress:  No. Affected by fatigue:  No. Spills soup if on spoon:  Yes.   Spills glass of liquid if full:  Yes.   Affects ADL's (tying shoes, brushing teeth, etc):  Yes.   (has to shave with 2 hands with blade)  Current/Previously tried tremor medications: primidone; beta blocker for BP  Outside reports reviewed: historical medical records and office notes.  04/07/15 update:  Logan Griffith returns today, accompanied by his wife and son who supplement Logan history.  Logan Griffith had neuropsych testing done by Dr. Richrd Sox on  03/18/2015.  She felt that he would be a good DBS candidate.  No evidence of dementia.  Logan Griffith remains on primidone, 250 mg twice a day for his tremor.  He is also on labetalol 300 mg twice a day but that is primarily for his blood pressure.  He has a history of nephrolithiasis and cannot take Topamax.  He remains interested in DBS therapy.  06/18/15 update: Logan Griffith follows up with me today, and final preparation for his DBS surgery.  It is scheduled for 07/30/2014.  He remains on primidone, 250 mg but is down to once a day.  He has seen Dr. Vertell Limber and reviewed logistics of surgery with him.  He expresses desire to proceed with DBS therapy.  09/04/15 update:  Logan Griffith is following up today, accompanied by his wife, son, daughter in law who supplement Logan history.  Logan Griffith is status post DBS surgery to Logan left VIM on 07/31/2015.  He had his generator placed on 08/07/2015.  He did have difficulty voiding after Logan surgery and did have to leave Logan hospital with a Foley in place.  He has had some nausea following surgery.    09/11/15 update:  Logan Griffith is following up today, accompanied by his wife who supplements Logan history.  Logan Griffith reports that  he has been doing well this week.  He is happy to report that he has written checks from Logan checkbook for Logan first time in a long time.  He also states that he has been shave with one hand.  He asks me about potentially having DBS for Logan left hand.  He continues to have some nausea.  I have reviewed records from his primary care provider from [place].  They started him on ranitidine.  He is not sure if it is helping.  He has episodes of emesis up to 3 times per week.  This seemed to start after surgery.  No Known Allergies  Outpatient Encounter Prescriptions as of 09/11/2015  Medication Sig  . amLODipine (NORVASC) 10 MG tablet Take 10 mg by mouth daily.  Marland Kitchen aspirin 81 MG tablet Take 81 mg by mouth daily.  Marland Kitchen atorvastatin (LIPITOR) 40 MG tablet  Take 40 mg by mouth daily.  . beta carotene w/minerals (OCUVITE) tablet Take 1 tablet by mouth daily.  . clotrimazole (LOTRIMIN) 1 % cream Apply 1 application topically 2 (two) times daily.  . empagliflozin (JARDIANCE) 25 MG TABS tablet Take 25 mg by mouth daily.  . finasteride (PROSCAR) 5 MG tablet Take 5 mg by mouth daily.   Marland Kitchen gabapentin (NEURONTIN) 600 MG tablet Take 600 mg by mouth 2 (two) times daily.  Marland Kitchen gemfibrozil (LOPID) 600 MG tablet Take 600 mg by mouth 2 (two) times daily before a meal.  . glimepiride (AMARYL) 4 MG tablet Take 4 mg by mouth daily with breakfast.  . HYDROcodone-acetaminophen (NORCO/VICODIN) 5-325 MG tablet Take 1-2 tablets by mouth every 4 (four) hours as needed (mild pain).  Marland Kitchen labetalol (NORMODYNE) 300 MG tablet Take 300 mg by mouth 2 (two) times daily.  Marland Kitchen linagliptin (TRADJENTA) 5 MG TABS tablet Take 5 mg by mouth daily.  Marland Kitchen losartan-hydrochlorothiazide (HYZAAR) 100-25 MG per tablet Take 1 tablet by mouth daily.  Marland Kitchen oxybutynin (DITROPAN) 5 MG tablet Take 5 mg by mouth 3 (three) times daily.  . pimecrolimus (ELIDEL) 1 % cream Apply 1 application topically daily as needed (for psoriasis).  . Potassium 99 MG TABS Take 99 mg by mouth daily.   . sertraline (ZOLOFT) 100 MG tablet Take 100 mg by mouth daily.  . tamsulosin (FLOMAX) 0.4 MG CAPS capsule Take 0.4 mg by mouth.  Nelva Nay SOLOSTAR 300 UNIT/ML SOPN Inject 55 Units into Logan skin at bedtime.   . vitamin B-12 (CYANOCOBALAMIN) 1000 MCG tablet Take 1,000 mcg by mouth daily.   No facility-administered encounter medications on file as of 09/11/2015.    Past Medical History  Diagnosis Date  . Hypertension   . Peripheral neuropathy ([place])   . Tremor   . Depression   . Prostate disorder   . Back pain   . History of kidney stones   . Diabetes ([place])     Type II  . Hyperlipemia   . Arthritis   . Sleep apnea   . Hemorrhoids     external  . BPH (benign prostatic hyperplasia)   . Nocturia   . Psoriasis     Past  Surgical History  Procedure Laterality Date  . Ankle arthroscopy Left     x 2  . Circumcision    . Thumb surgery Left     ligament  . Ankle fusion Left   . Colonoscopy    . Eye surgery Bilateral     blepharoplasty  . Carpal tunnel release N/A 07/24/2015  Procedure: Fiducial placement for deep brain stimulator;  Surgeon: Erline Levine, MD;  Location: [facility];  Service: Neurosurgery;  Laterality: N/A;  Fiducial placement for deep brain stimulator  . Pulse generator implant Left 08/07/2015    Procedure: UNILATERAL PULSE GENERATOR IMPLANT (LEFT);  Surgeon: Erline Levine, MD;  Location: [facility];  Service: Neurosurgery;  Laterality: Left;  UNILATERAL PULSE GENERATOR IMPLANT (LEFT)  . Subthalamic stimulator insertion Left 07/31/2015    Procedure: LEFT DEEP BRAIN STIMULATOR INSERTION;  Surgeon: Erline Levine, MD;  Location: [place];  Service: Neurosurgery;  Laterality: Left;  LEFT DEEP BRAIN STIMULATOR INSERTION    Social History   Social History  . Marital Status: Married    Spouse Name: N/A  . Number of Children: N/A  . Years of Education: N/A   Occupational History  . Not on file.   Social History Main Topics  . Smoking status: Former Smoker -- 24 years    Quit date: 06/06/1982  . Smokeless tobacco: Not on file  . Alcohol Use: 0.0 oz/week    0 Standard drinks or equivalent per week     Comment: ocassional  . Drug Use: No  . Sexual Activity: Not on file   Other Topics Concern  . Not on file   Social History Narrative    Family Status  Relation Status Death Age  . Mother Deceased     HTN  . Father Deceased     HTN  . Brother Alive     Review of Systems  A complete 10 system ROS was obtained and was negative apart from what is mentioned.   Objective:   VITALS:   Filed Vitals:   09/11/15 1445  BP: 160/70  Pulse: 85  Height: 6\' 1"  (1.854 m)  Weight: 260 lb (117.935 kg)   Gen:  Appears stated age and in NAD. HEENT:  Normocephalic, atraumatic. Logan  mucous membranes are moist. Logan superficial temporal arteries are without ropiness or tenderness. Cardiovascular: Regular rate and rhythm. Lungs: Clear to auscultation bilaterally. Neck: There are no carotid bruits noted bilaterally.  NEUROLOGICAL:  Orientation:  Logan Griffith is alert and oriented x 3.   Cranial nerves: There is good facial symmetry.  Speech is fluent and clear. Soft palate rises symmetrically and there is no tongue deviation. Hearing is intact to conversational tone. Tone: Tone is good throughout. Sensation: Sensation is intact to light touch throughout Coordination:  Logan Griffith has no dysdiadichokinesia or dysmetria. Motor: Strength is 5/5 in Logan bilateral upper and lower extremities.  Shoulder shrug is equal bilaterally.  There is no pronator drift.  There are no fasciculations noted. Gait and Station: Logan Griffith ambulates in a very antalgic fashion stating that he has a bad ankle  MOVEMENT EXAM: Tremor:  Very minimal on Logan right and none following re-programming.  Mild-mod on Logan L       Assessment/Plan:   1.  Essential Tremor.  -Logan Griffith is status post DBS surgery to Logan left VIM on 07/31/2015.  He had his generator placed on 08/07/2015.  -Logan Griffith's device was activated on 09/04/2015.  -Postop video was taken today. 2.  Diabetic peripheral neuropathy  -I do think that this Logan main reason for loss of balance.  We discussed control of diabetes.  Has been drinking more soda and talked about changing to water. 3.  Nausea, following surgery  -He has had nausea following surgery.  This can definitely have been if we are stimulating to medially, but  this happened prior to [place] turning on Logan device.  I did, however, reprogram his lead today and he will let me know if this helps at all, but again Logan nausea started before I even turned on Logan device.  I do not think that Logan lead is placed too medially, so do not think that Logan lead itself would cause Logan nausea.   Regardless, we will call him next week and see how he is doing with reprogramming.  He will also stay on Logan ranitidine that his primary care provider recently prescribed. 4.  Much greater than 50% of this visit was spent in counseling and coordinating care.  Total face to face time:  30 min, not including DBS programming time

## 2015-09-11 NOTE — Procedures (Signed)
DBS Programming was performed.    Total time spent programming was 35 minutes.  Device was confirmed to be on.  Soft start was turned on at 8.  Impedences were checked and were within normal limits.  Battery was checked and was determined to be functioning normally and not near the end of life.  Final settings were as follows:  Left brain electrode:     1-2+           ; Amplitude  2.5   V   ; Pulse width 90 microseconds;   Frequency   160   Hz.  Right brain electrode:     n/a

## 2015-09-16 ENCOUNTER — Telehealth: Payer: Self-pay | Admitting: Neurology

## 2015-09-16 NOTE — Telephone Encounter (Signed)
Spoke with patient- he states he has not had constant nausea. He only had one episode since he was seen last Friday of nausea and vomiting that woke him at 1:00 am. He had eaten hot dogs earlier for supper and attributes it to this. He has been eating better and has had no problems. He would like to give it another week to see how it goes and I told him that I would make Dr Tat aware and call him in one week to check on him. He will call if symptoms get worse.

## 2015-09-16 NOTE — Telephone Encounter (Signed)
Left message on machine for patient to call back. To see how is nausea is doing. Awaiting call back.

## 2015-09-25 ENCOUNTER — Telehealth: Payer: Self-pay | Admitting: Neurology

## 2015-09-25 NOTE — Telephone Encounter (Signed)
Logan Griffith 10/06/1940. He called with a concern about his scalp. He said he has had deep brain surgery and has some questions. His number is [phone]. Thank you

## 2015-09-25 NOTE — Telephone Encounter (Signed)
Spoke with patient and he states a raised area anterior to his incision site. Made aware this is a plastic cap covering a coiled wire and completely normal. His nausea is okay. No nausea. He has had one episode of vomiting in the last week. He will call with any other questions/problems.

## 2015-11-19 ENCOUNTER — Encounter: Payer: Self-pay | Admitting: Neurology

## 2015-11-19 ENCOUNTER — Ambulatory Visit (INDEPENDENT_AMBULATORY_CARE_PROVIDER_SITE_OTHER): Payer: Medicare Other | Admitting: Neurology

## 2015-11-19 VITALS — BP 138/82 | HR 78 | Ht 73.0 in | Wt 261.0 lb

## 2015-11-19 DIAGNOSIS — G250 Essential tremor: Secondary | ICD-10-CM | POA: Diagnosis not present

## 2015-11-19 DIAGNOSIS — R112 Nausea with vomiting, unspecified: Secondary | ICD-10-CM

## 2015-11-19 DIAGNOSIS — Z9689 Presence of other specified functional implants: Secondary | ICD-10-CM | POA: Diagnosis not present

## 2015-11-19 NOTE — Procedures (Signed)
DBS Programming was performed.    Total time spent programming was 25 minutes.  Device was confirmed to be on.  Soft start was on at 8.  Impedences were checked and were within normal limits.  Battery was checked and was determined to be functioning normally and not near the end of life (3.00 V).  Final settings were as follows:  Left brain electrode:     1-2+           ; Amplitude  2.6   V   ; Pulse width 90 microseconds;   Frequency   160   Hz.  Right brain electrode:     n/a

## 2015-11-19 NOTE — Progress Notes (Signed)
Subjective:   Logan Griffith was seen in consultation in the movement disorder clinic at the request of [facility].  The evaluation is for tremor.  The patient is seen in consultation at the request of Dr. Jannifer Griffith.  I have reviewed her notes and appreciate those.  This patient is accompanied in the office by his spouse and child who supplement the history.  The patient has had essential tremor for approximately 5 years according to records.   It has been worse over the last 2 years.  It is in both hands.  He notices it most in the R hand but he is R hand dominant.  He has trouble writing checks and eating sounds.  The patient is on primidone, 250 mg daily.  He has never been on a higher dosage than this but he indicates that it is not helpful.  The patient states he is on labetalol, 300 mg twice a day.  He was told that he could take a higher dosage of this to see if that helped his tremor, but he has not done that.  Pt states that the labetolol is for BP, however.   He has noticed no benefit with the primidone.  Unfortunately, these medications are not helping.  He does have a history of kidney stones, and therefore is not able to have Topamax.  There is no family hx of tremor.  He comes in today looking for an opinion regarding DBS therapy.   Affected by caffeine:  No. (1-2 cups coffee/day - 18 oz each; 1-2 diet soda's a day) Affected by alcohol:  No. Affected by stress:  No. Affected by fatigue:  No. Spills soup if on spoon:  Yes.   Spills glass of liquid if full:  Yes.   Affects ADL's (tying shoes, brushing teeth, etc):  Yes.   (has to shave with 2 hands with blade)  Current/Previously tried tremor medications: primidone; beta blocker for BP  Outside reports reviewed: historical medical records and office notes.  04/07/15 update:  The patient returns today, accompanied by his wife and son who supplement the history.  The patient had neuropsych testing done by Dr. Richrd Griffith on  03/18/2015.  She felt that he would be a good DBS candidate.  No evidence of dementia.  The patient remains on primidone, 250 mg twice a day for his tremor.  He is also on labetalol 300 mg twice a day but that is primarily for his blood pressure.  He has a history of nephrolithiasis and cannot take Topamax.  He remains interested in DBS therapy.  06/18/15 update: The patient follows up with me today, and final preparation for his DBS surgery.  It is scheduled for 07/30/2014.  He remains on primidone, 250 mg but is down to once a day.  He has seen Dr. Vertell Griffith and reviewed logistics of surgery with him.  He expresses desire to proceed with DBS therapy.  09/04/15 update:  The patient is following up today, accompanied by his wife, son, daughter in law who supplement the history.  The patient is status post DBS surgery to the left VIM on 07/31/2015.  He had his generator placed on 08/07/2015.  He did have difficulty voiding after the surgery and did have to leave the hospital with a Foley in place.  He has had some nausea following surgery.    09/11/15 update:  The patient is following up today, accompanied by his wife who supplements the history.  The patient reports that  he has been doing well this week.  He is happy to report that he has written checks from the checkbook for the first time in a long time.  He also states that he has been shave with one hand.  He asks me about potentially having DBS for the left hand.  He continues to have some nausea.  I have reviewed records from his primary care provider [facility].  They started him on ranitidine.  He is not sure if it is helping.  He has episodes of emesis up to 3 times per week.  This seemed to start after surgery.  11/19/15 update:  The patient is following up today for his ET.  The patient is status post DBS surgery to the left VIM on 07/31/2015.  He had his generator placed on 08/07/2015.  He had places froze on his arms and legs.  Nausea is much better  than previous but "it has been a long while."  He thinks that is related to eating too much.  Overall, very happy with DBS.  No significant tremor on the right hand.  He saw Dr. Vertell Griffith yesterday and pt mentioned that he is thinking about doing DBS on the other side.    No Known Allergies  Outpatient Encounter Prescriptions as of 11/19/2015  Medication Sig  . amLODipine (NORVASC) 10 MG tablet Take 10 mg by mouth daily.  Marland Kitchen aspirin 81 MG tablet Take 81 mg by mouth daily.  Marland Kitchen atorvastatin (LIPITOR) 40 MG tablet Take 40 mg by mouth daily.  . beta carotene w/minerals (OCUVITE) tablet Take 1 tablet by mouth daily.  . clotrimazole (LOTRIMIN) 1 % cream Apply 1 application topically 2 (two) times daily.  . empagliflozin (JARDIANCE) 25 MG TABS tablet Take 25 mg by mouth daily.  . finasteride (PROSCAR) 5 MG tablet Take 5 mg by mouth daily.   Marland Kitchen gabapentin (NEURONTIN) 600 MG tablet Take 600 mg by mouth 2 (two) times daily.  Marland Kitchen gemfibrozil (LOPID) 600 MG tablet Take 600 mg by mouth 2 (two) times daily before a meal.  . glimepiride (AMARYL) 4 MG tablet Take 4 mg by mouth daily with breakfast.  . HYDROcodone-acetaminophen (NORCO/VICODIN) 5-325 MG tablet Take 1-2 tablets by mouth every 4 (four) hours as needed (mild pain).  Marland Kitchen labetalol (NORMODYNE) 300 MG tablet Take 300 mg by mouth 2 (two) times daily.  Marland Kitchen linagliptin (TRADJENTA) 5 MG TABS tablet Take 5 mg by mouth daily.  Marland Kitchen losartan-hydrochlorothiazide (HYZAAR) 100-25 MG per tablet Take 1 tablet by mouth daily.  Marland Kitchen oxybutynin (DITROPAN) 5 MG tablet Take 5 mg by mouth 3 (three) times daily.  . pimecrolimus (ELIDEL) 1 % cream Apply 1 application topically daily as needed (for psoriasis).  . Potassium 99 MG TABS Take 99 mg by mouth daily.   . sertraline (ZOLOFT) 100 MG tablet Take 100 mg by mouth daily.  . tamsulosin (FLOMAX) 0.4 MG CAPS capsule Take 0.4 mg by mouth.  Nelva Nay SOLOSTAR 300 UNIT/ML SOPN Inject 55 Units into the skin at bedtime.   . vitamin B-12  (CYANOCOBALAMIN) 1000 MCG tablet Take 1,000 mcg by mouth daily.   No facility-administered encounter medications on file as of 11/19/2015.    Past Medical History  Diagnosis Date  . Hypertension   . Peripheral neuropathy ([place])   . Tremor   . Depression   . Prostate disorder   . Back pain   . History of kidney stones   . Diabetes ([place])  Type II  . Hyperlipemia   . Arthritis   . Sleep apnea   . Hemorrhoids     external  . BPH (benign prostatic hyperplasia)   . Nocturia   . Psoriasis     Past Surgical History  Procedure Laterality Date  . Ankle arthroscopy Left     x 2  . Circumcision    . Thumb surgery Left     ligament  . Ankle fusion Left   . Colonoscopy    . Eye surgery Bilateral     blepharoplasty  . Carpal tunnel release N/A 07/24/2015    Procedure: Fiducial placement for deep brain stimulator;  Surgeon: Erline Levine, MD;  Location: [facility];  Service: Neurosurgery;  Laterality: N/A;  Fiducial placement for deep brain stimulator  . Pulse generator implant Left 08/07/2015    Procedure: UNILATERAL PULSE GENERATOR IMPLANT (LEFT);  Surgeon: Erline Levine, MD;  Location: [facility];  Service: Neurosurgery;  Laterality: Left;  UNILATERAL PULSE GENERATOR IMPLANT (LEFT)  . Subthalamic stimulator insertion Left 07/31/2015    Procedure: LEFT DEEP BRAIN STIMULATOR INSERTION;  Surgeon: Erline Levine, MD;  Location: [facility];  Service: Neurosurgery;  Laterality: Left;  LEFT DEEP BRAIN STIMULATOR INSERTION    Social History   Social History  . Marital Status: Married    Spouse Name: N/A  . Number of Children: N/A  . Years of Education: N/A   Occupational History  . Not on file.   Social History Main Topics  . Smoking status: Former Smoker -- 24 years    Quit date: 06/06/1982  . Smokeless tobacco: Not on file  . Alcohol Use: 0.0 oz/week    0 Standard drinks or equivalent per week     Comment: ocassional  . Drug Use: No  . Sexual Activity: Not on file    Other Topics Concern  . Not on file   Social History Narrative    Family Status  Relation Status Death Age  . Mother Deceased     HTN  . Father Deceased     HTN  . Brother Alive     Review of Systems  A complete 10 system ROS was obtained and was negative apart from what is mentioned.   Objective:   VITALS:   Filed Vitals:   11/19/15 1450  BP: 138/82  Pulse: 78  Height: 6\' 1"  (1.854 m)  Weight: 261 lb (118.389 kg)   Gen:  Appears stated age and in NAD. HEENT:  Normocephalic, atraumatic. The mucous membranes are moist. The superficial temporal arteries are without ropiness or tenderness. Cardiovascular: Regular rate and rhythm. Lungs: Clear to auscultation bilaterally. Neck: There are no carotid bruits noted bilaterally.  NEUROLOGICAL:  Orientation:  The patient is alert and oriented x 3.   Cranial nerves: There is good facial symmetry.  Speech is fluent and clear. Soft palate rises symmetrically and there is no tongue deviation. Hearing is intact to conversational tone. Tone: Tone is good throughout. Sensation: Sensation is intact to light touch throughout Coordination:  The patient has no dysdiadichokinesia or dysmetria. Motor: Strength is 5/5 in the bilateral upper and lower extremities.  Shoulder shrug is equal bilaterally.  There is no pronator drift.  There are no fasciculations noted. Gait and Station: The patient ambulates in a very antalgic fashion stating that he has a bad ankle  MOVEMENT EXAM: Tremor:  Very minimal in the RUE and does have some in the RLE.  Mild-mod on the L  Assessment/Plan:   1.  Essential Tremor.  -The patient is status post DBS surgery to the left VIM on 07/31/2015.  He had his generator placed on 08/07/2015.  -The patient's device was activated on 09/04/2015.  -Patient interested in DBS on the opposite side.  Talked to him and asked him to really think about this.  He is very happy with the dominant hand.   2.  Diabetic  peripheral neuropathy  -I do think that this the main reason for loss of balance.  We discussed control of diabetes.   3.  Nausea, following surgery  -He has had nausea following surgery.  This seemed to improve somewhat after reprogramming his device, but also seemed to improve following treatments for reflux and watching the diet.  He still has issues if he overeats.  It is markedly better than it was. 4.  Much greater than 50% of this visit was spent in counseling and coordinating care.  Total face to face time:  20 min, not including DBS programming time

## 2016-01-28 ENCOUNTER — Telehealth: Payer: Self-pay | Admitting: Neurology

## 2016-01-28 NOTE — Telephone Encounter (Signed)
Patient needs to talk to someone about his surgery he has something on his head that he has a question about please call [phone]

## 2016-01-28 NOTE — Telephone Encounter (Signed)
Patient called about fiducial on his head. Made him aware this was normal. Will call with any problems and keep appt in October.

## 2016-03-21 NOTE — Progress Notes (Signed)
Subjective:   Logan Griffith was seen in consultation in the movement disorder clinic at the request of [facility].  The evaluation is for tremor.  The patient is seen in consultation at the request of Dr. Jannifer Griffith.  I have reviewed her notes and appreciate those.  This patient is accompanied in the office by his spouse and child who supplement the history.  The patient has had essential tremor for approximately 5 years according to records.   It has been worse over the last 2 years.  It is in both hands.  He notices it most in the R hand but he is R hand dominant.  He has trouble writing checks and eating sounds.  The patient is on primidone, 250 mg daily.  He has never been on a higher dosage than this but he indicates that it is not helpful.  The patient states he is on labetalol, 300 mg twice a day.  He was told that he could take a higher dosage of this to see if that helped his tremor, but he has not done that.  Pt states that the labetolol is for BP, however.   He has noticed no benefit with the primidone.  Unfortunately, these medications are not helping.  He does have a history of kidney stones, and therefore is not able to have Topamax.  There is no family hx of tremor.  He comes in today looking for an opinion regarding DBS therapy.   Affected by caffeine:  No. (1-2 cups coffee/day - 18 oz each; 1-2 diet soda's a day) Affected by alcohol:  No. Affected by stress:  No. Affected by fatigue:  No. Spills soup if on spoon:  Yes.   Spills glass of liquid if full:  Yes.   Affects ADL's (tying shoes, brushing teeth, etc):  Yes.   (has to shave with 2 hands with blade)  Current/Previously tried tremor medications: primidone; beta blocker for BP  Outside reports reviewed: historical medical records and office notes.  04/07/15 update:  The patient returns today, accompanied by his wife and son who supplement the history.  The patient had neuropsych testing done by Dr. Richrd Griffith on  03/18/2015.  She felt that he would be a good DBS candidate.  No evidence of dementia.  The patient remains on primidone, 250 mg twice a day for his tremor.  He is also on labetalol 300 mg twice a day but that is primarily for his blood pressure.  He has a history of nephrolithiasis and cannot take Topamax.  He remains interested in DBS therapy.  06/18/15 update: The patient follows up with me today, and final preparation for his DBS surgery.  It is scheduled for 07/30/2014.  He remains on primidone, 250 mg but is down to once a day.  He has seen Dr. Vertell Griffith and reviewed logistics of surgery with him.  He expresses desire to proceed with DBS therapy.  09/04/15 update:  The patient is following up today, accompanied by his wife, son, daughter in law who supplement the history.  The patient is status post DBS surgery to the left VIM on 07/31/2015.  He had his generator placed on 08/07/2015.  He did have difficulty voiding after the surgery and did have to leave the hospital with a Foley in place.  He has had some nausea following surgery.    09/11/15 update:  The patient is following up today, accompanied by his wife who supplements the history.  The patient reports that  he has been doing well this week.  He is happy to report that he has written checks from the checkbook for the first time in a long time.  He also states that he has been shave with one hand.  He asks me about potentially having DBS for the left hand.  He continues to have some nausea.  I have reviewed records from his primary care provider [facility] [place].  They started him on ranitidine.  He is not sure if it is helping.  He has episodes of emesis up to 3 times per week.  This seemed to start after surgery.  11/19/15 update:  The patient is following up today for his ET.  The patient is status post DBS surgery to the left VIM on 07/31/2015.  He had his generator placed on 08/07/2015.  He had places froze on his arms and legs.  Nausea is much better  than previous but "it has been a long while."  He thinks that is related to eating too much.  Overall, very happy with DBS.  No significant tremor on the right hand.  He saw Dr. Vertell Griffith yesterday and pt mentioned that he is thinking about doing DBS on the other side.    03/23/16 update:  Pt f/u for ET.  He is doing well with regards to tremor in the right hand.  He wants to do DBS on the other side.  Asks me if we can get it done after the first of the year.  He eats in the living room and holds the glass in one hand and has trouble holding food with the other hand.   In regards to nausea he states that he has been doing well.  He had a few falls trying to tie his shoes and fell out of the chair (just slipped off the cushion).  He is in PT now.  No Known Allergies  Outpatient Encounter Prescriptions as of 03/23/2016  Medication Sig  . amLODipine (NORVASC) 10 MG tablet Take 10 mg by mouth daily.  Marland Kitchen aspirin 81 MG tablet Take 81 mg by mouth daily.  Marland Kitchen atorvastatin (LIPITOR) 40 MG tablet Take 40 mg by mouth daily.  Marland Kitchen b complex vitamins tablet Take 1 tablet by mouth daily.  . beta carotene w/minerals (OCUVITE) tablet Take 1 tablet by mouth daily.  . clotrimazole (LOTRIMIN) 1 % cream Apply 1 application topically 2 (two) times daily.  . empagliflozin (JARDIANCE) 25 MG TABS tablet Take 25 mg by mouth daily.  . finasteride (PROSCAR) 5 MG tablet Take 5 mg by mouth daily.   Marland Kitchen gabapentin (NEURONTIN) 600 MG tablet Take 600 mg by mouth 2 (two) times daily.  Marland Kitchen gemfibrozil (LOPID) 600 MG tablet Take 600 mg by mouth 2 (two) times daily before a meal.  . glimepiride (AMARYL) 4 MG tablet Take 4 mg by mouth daily with breakfast.  . HYDROcodone-acetaminophen (NORCO/VICODIN) 5-325 MG tablet Take 1-2 tablets by mouth every 4 (four) hours as needed (mild pain).  Marland Kitchen labetalol (NORMODYNE) 300 MG tablet Take 300 mg by mouth 2 (two) times daily.  Marland Kitchen linagliptin (TRADJENTA) 5 MG TABS tablet Take 5 mg by mouth daily.  Marland Kitchen  losartan-hydrochlorothiazide (HYZAAR) 100-25 MG per tablet Take 1 tablet by mouth daily.  Marland Kitchen oxybutynin (DITROPAN) 5 MG tablet Take 5 mg by mouth 3 (three) times daily.  . pimecrolimus (ELIDEL) 1 % cream Apply 1 application topically daily as needed (for psoriasis).  . Potassium 99 MG TABS Take 99 mg  by mouth daily.   . sertraline (ZOLOFT) 100 MG tablet Take 100 mg by mouth daily.  . tamsulosin (FLOMAX) 0.4 MG CAPS capsule Take 0.4 mg by mouth.  Nelva Nay SOLOSTAR 300 UNIT/ML SOPN Inject 55 Units into the skin at bedtime.   . vitamin B-12 (CYANOCOBALAMIN) 1000 MCG tablet Take 1,000 mcg by mouth daily.   No facility-administered encounter medications on file as of 03/23/2016.     Past Medical History:  Diagnosis Date  . Arthritis   . Back pain   . BPH (benign prostatic hyperplasia)   . Depression   . Diabetes ([place])    Type II  . Hemorrhoids    external  . History of kidney stones   . Hyperlipemia   . Hypertension   . Nocturia   . Peripheral neuropathy ([place])   . Prostate disorder   . Psoriasis   . Sleep apnea   . Tremor     Past Surgical History:  Procedure Laterality Date  . ANKLE ARTHROSCOPY Left    x 2  . ANKLE FUSION Left   . CARPAL TUNNEL RELEASE N/A 07/24/2015   Procedure: Fiducial placement for deep brain stimulator;  Surgeon: Erline Levine, MD;  Location: [facility];  Service: Neurosurgery;  Laterality: N/A;  Fiducial placement for deep brain stimulator  . CIRCUMCISION    . COLONOSCOPY    . EYE SURGERY Bilateral    blepharoplasty  . PULSE GENERATOR IMPLANT Left 08/07/2015   Procedure: UNILATERAL PULSE GENERATOR IMPLANT (LEFT);  Surgeon: Erline Levine, MD;  Location: [facility];  Service: Neurosurgery;  Laterality: Left;  UNILATERAL PULSE GENERATOR IMPLANT (LEFT)  . SUBTHALAMIC STIMULATOR INSERTION Left 07/31/2015   Procedure: LEFT DEEP BRAIN STIMULATOR INSERTION;  Surgeon: Erline Levine, MD;  Location: [place] [facility];  Service: Neurosurgery;  Laterality: Left;  LEFT DEEP  BRAIN STIMULATOR INSERTION  . Thumb surgery Left    ligament    Social History   Social History  . Marital status: Married    Spouse name: N/A  . Number of children: N/A  . Years of education: N/A   Occupational History  . Not on file.   Social History Main Topics  . Smoking status: Former Smoker    Years: 24.00    Quit date: 06/06/1982  . Smokeless tobacco: Not on file  . Alcohol use 0.0 oz/week     Comment: ocassional  . Drug use: No  . Sexual activity: Not on file   Other Topics Concern  . Not on file   Social History Narrative  . No narrative on file    Family Status  Relation Status  . Mother Deceased   HTN  . Father Deceased   HTN  . Brother Alive    Review of Systems  A complete 10 system ROS was obtained and was negative apart from what is mentioned.   Objective:   VITALS:   Vitals:   03/23/16 0918  BP: 138/70  Pulse: 74  Weight: 265 lb (120.2 kg)  Height: 6\' 1"  (1.854 m)   Gen:  Appears stated age and in NAD. HEENT:  Normocephalic, atraumatic. The mucous membranes are moist. The superficial temporal arteries are without ropiness or tenderness. Cardiovascular: Regular rate and rhythm. Lungs: Clear to auscultation bilaterally. Neck: There are no carotid bruits noted bilaterally.  NEUROLOGICAL:  Orientation:  The patient is alert and oriented x 3.   Cranial nerves: There is good facial symmetry.  Speech is fluent and clear. Soft palate rises  symmetrically and there is no tongue deviation. Hearing is intact to conversational tone. Tone: Tone is good throughout. Sensation: Sensation is intact to light touch throughout Coordination:  The patient has no dysdiadichokinesia or dysmetria. Motor: Strength is 5/5 in the bilateral upper and lower extremities.  Shoulder shrug is equal bilaterally.  There is no pronator drift.  There are no fasciculations noted. Gait and Station: The patient ambulates in a very antalgic fashion stating that he has a bad  ankle  MOVEMENT EXAM: Tremor:  No tremor in the R hand.  Has tremor of the L hand, worse when given something of weight.         Assessment/Plan:   1.  Essential Tremor.  -The patient is status post DBS surgery to the left VIM on 07/31/2015.  He had his generator placed on 08/07/2015.  -The patient's device was activated on 09/04/2015.  -Patient interested in DBS on the opposite side. Has thought about it hard and wants to proceed.  Knows well r/b/se.  Will refer back to Dr. Vertell Griffith.   2.  Diabetic peripheral neuropathy  -I do think that this the main reason for loss of balance.  We discussed control of diabetes.   3.  Nausea, following surgery  -He has had nausea following surgery.  This seemed to improve somewhat after reprogramming his device, but also seemed to improve following treatments for reflux and watching the diet.  He still has issues if he overeats.  It is markedly better than it was. 4.  Much greater than 50% of this visit was spent in counseling and coordinating care.  Total face to face time:  20 min

## 2016-03-23 ENCOUNTER — Ambulatory Visit (INDEPENDENT_AMBULATORY_CARE_PROVIDER_SITE_OTHER): Payer: Medicare Other | Admitting: Neurology

## 2016-03-23 ENCOUNTER — Encounter: Payer: Self-pay | Admitting: Neurology

## 2016-03-23 VITALS — BP 138/70 | HR 74 | Ht 73.0 in | Wt 265.0 lb

## 2016-03-23 DIAGNOSIS — Z9689 Presence of other specified functional implants: Secondary | ICD-10-CM

## 2016-03-23 DIAGNOSIS — G250 Essential tremor: Secondary | ICD-10-CM | POA: Diagnosis not present

## 2016-03-23 NOTE — Procedures (Signed)
DBS Programming was performed.    Total time spent programming was 25 minutes.  Device was confirmed to be on.  Soft start was on at 8.  Impedences were checked and were within normal limits.  Battery was checked and was determined to be functioning normally and not near the end of life (2.98 V).  Final settings were as follows:  Left brain electrode:     1-2+           ; Amplitude  2.6   V   ; Pulse width 90 microseconds;   Frequency   160   Hz.  Right brain electrode:     n/a

## 2016-04-05 ENCOUNTER — Ambulatory Visit: Payer: Medicare Other | Admitting: Neurology

## 2016-04-12 ENCOUNTER — Other Ambulatory Visit: Payer: Self-pay | Admitting: Neurosurgery

## 2016-04-12 ENCOUNTER — Other Ambulatory Visit (HOSPITAL_COMMUNITY): Payer: Self-pay | Admitting: Neurosurgery

## 2016-04-12 DIAGNOSIS — G250 Essential tremor: Secondary | ICD-10-CM

## 2016-05-04 ENCOUNTER — Telehealth: Payer: Self-pay | Admitting: Neurology

## 2016-05-04 DIAGNOSIS — R251 Tremor, unspecified: Secondary | ICD-10-CM

## 2016-05-04 DIAGNOSIS — Z01818 Encounter for other preprocedural examination: Secondary | ICD-10-CM

## 2016-05-04 NOTE — Telephone Encounter (Signed)
-----   Message from [place], DO sent at 04/30/2016 12:37 PM EST ----- See me about his next pre-op MRI

## 2016-05-04 NOTE — Telephone Encounter (Signed)
Patient made aware he needs preop MR.   He states around 11-2 is a good time for him and no Tuesdays.   Dusty will set up so he can be present and let me know date/time to make the patient aware.

## 2016-05-04 NOTE — Telephone Encounter (Signed)
Patient needs preop protocol for DBS on 1.5T scanner MRI. [employer] Rep needs to be present.

## 2016-05-05 ENCOUNTER — Other Ambulatory Visit: Payer: Self-pay | Admitting: Neurology

## 2016-05-18 ENCOUNTER — Encounter: Payer: Self-pay | Admitting: Neurology

## 2016-05-24 ENCOUNTER — Telehealth: Payer: Self-pay | Admitting: Neurology

## 2016-05-24 DIAGNOSIS — R251 Tremor, unspecified: Secondary | ICD-10-CM

## 2016-05-24 NOTE — Telephone Encounter (Signed)
Please call Brook about patient's MRI tomorrow.  She said the orders have been changed many times. [phone]

## 2016-05-24 NOTE — Telephone Encounter (Signed)
Spoke with [facility] and cleared up confusion.

## 2016-05-25 ENCOUNTER — Ambulatory Visit (HOSPITAL_COMMUNITY)
Admission: RE | Admit: 2016-05-25 | Discharge: 2016-05-25 | Disposition: A | Discharge status: Home / Self Care | Payer: Medicare Other | Source: Ambulatory Visit | Attending: Neurology | Admitting: Neurology

## 2016-05-25 DIAGNOSIS — D320 Benign neoplasm of cerebral meninges: Secondary | ICD-10-CM | POA: Diagnosis not present

## 2016-05-25 DIAGNOSIS — Z01818 Encounter for other preprocedural examination: Secondary | ICD-10-CM | POA: Diagnosis present

## 2016-05-25 DIAGNOSIS — R251 Tremor, unspecified: Secondary | ICD-10-CM

## 2016-05-25 DIAGNOSIS — I6782 Cerebral ischemia: Secondary | ICD-10-CM | POA: Insufficient documentation

## 2016-05-25 LAB — POCT I-STAT CREATININE: CREATININE: 1.7 mg/dL — AB (ref 0.61–1.24)

## 2016-05-25 MED ORDER — GADOBENATE DIMEGLUMINE 529 MG/ML IV SOLN
10.0000 mL | Freq: Once | INTRAVENOUS | Status: AC | PRN
  Administered 2016-05-25: 10 mL via INTRAVENOUS

## 2016-06-16 ENCOUNTER — Encounter (HOSPITAL_COMMUNITY)
Admission: RE | Disposition: A | Discharge status: Home / Self Care | Payer: Self-pay | Source: Ambulatory Visit | Attending: Neurosurgery

## 2016-06-16 ENCOUNTER — Ambulatory Visit (HOSPITAL_COMMUNITY)
Admission: RE | Admit: 2016-06-16 | Discharge: 2016-06-16 | Disposition: A | Discharge status: Home / Self Care | Payer: Medicare Other | Source: Ambulatory Visit | Attending: Neurosurgery | Admitting: Neurosurgery

## 2016-06-16 ENCOUNTER — Ambulatory Visit (HOSPITAL_COMMUNITY): Payer: Medicare Other

## 2016-06-16 DIAGNOSIS — Z79899 Other long term (current) drug therapy: Secondary | ICD-10-CM | POA: Diagnosis not present

## 2016-06-16 DIAGNOSIS — R251 Tremor, unspecified: Secondary | ICD-10-CM | POA: Diagnosis present

## 2016-06-16 DIAGNOSIS — Z7982 Long term (current) use of aspirin: Secondary | ICD-10-CM | POA: Diagnosis not present

## 2016-06-16 DIAGNOSIS — G250 Essential tremor: Secondary | ICD-10-CM | POA: Diagnosis not present

## 2016-06-16 DIAGNOSIS — Z794 Long term (current) use of insulin: Secondary | ICD-10-CM | POA: Diagnosis not present

## 2016-06-16 SURGERY — MINOR PLACEMENT OF FIDUCIAL
Anesthesia: Choice

## 2016-06-16 MED ORDER — LIDOCAINE-EPINEPHRINE (PF) 2 %-1:200000 IJ SOLN
INTRAMUSCULAR | Status: AC
  Filled 2016-06-16: qty 20

## 2016-06-16 MED ORDER — SODIUM BICARBONATE 4 % IV SOLN
INTRAVENOUS | Status: AC
  Filled 2016-06-16: qty 5

## 2016-06-16 MED ORDER — BACITRACIN ZINC 500 UNIT/GM EX OINT
TOPICAL_OINTMENT | CUTANEOUS | Status: AC
  Filled 2016-06-16: qty 28.35

## 2016-06-16 NOTE — Op Note (Addendum)
Indications:  Patient has essential tremor and presents for Star Fix Fiducial Placement for upcoming DBS VIM placement.    Procedure:  Patient was brought to the procedure room.  His scalp had been shaved.  Areas of planned fiducial placement were marked, scalp was prepped with betadine.  Scalp was infiltrated with lidocaine with epinephrine.  Four fiducials were placed according to standard landmarks through stab incisions.  4-0 Nylon sutures were placed and sterile dressings were applied.  Patient tolerated procedure well.  He was taken for a head CT for preoperative planning.

## 2016-06-16 NOTE — H&P (Signed)
Patient ID:   [id number] Patient: Logan Griffith  Date of Birth: [birth year] Visit Type: Office Visit   Date: 04/04/2016 12:15 PM Provider: Marchia Meiers. Vertell Limber MD   This 76 year old male presents for Tremor.  History of Present Illness: 1.  Tremor    Left DBS placed in February and March 2017, performing well.  Patient hopes to schedule right DBS for January.      Medical/Surgical/Interim History Reviewed, no change.  Last detailed document date:05/18/2015.   PAST MEDICAL HISTORY, SURGICAL HISTORY, FAMILY HISTORY, SOCIAL HISTORY AND REVIEW OF SYSTEMS  05/18/2015, which I have signed.  Family History: Reviewed, no changes.  Last detailed document: 05/18/2015.   Social History: Tobacco use reviewed. Reviewed, no changes. Last detailed document date: 05/18/2015.      MEDICATIONS(added, continued or stopped this visit): Started Medication Directions Instruction Stopped   amlodipine 10 mg tablet take 1 tablet by oral route  every day     aspirin 81 mg chewable tablet chew 1 tablet by oral route  every day     Centrum Silver Take once daily     Cinnamon 500 mg capsule Take as directed     flaxseed oil 1,000 mg capsule Take once daily     gabapentin 600 mg tablet take 1 tablet by oral route 2 times every day     garlic Take as directed     gemfibrozil 600 mg tablet Take as directed     glimepiride 4 mg tablet Take as directed     HYDROCODONE/ACETAMINOPHEN take 1 - 2 tablets by oral route  every 6 hours as needed for pain     Jardiance 25 mg tablet Take as directed     labetalol 300 mg tablet take 1 tablet by oral route 2 times every day     Lipitor 40 mg tablet Take as directed     losartan 100 mg-hydrochlorothiazide 25 mg tablet take 1 tablet by oral route  every day     MSM 1,000 mg tablet Take as directed     OCUVITE LUTEIN & ZEAXANTHIN Take once daily     oxybutynin chloride 5 mg tablet Take as directed     potassium 99 mg tablet Take as directed     primidone 250 mg  tablet Take as directed     probiotic Take as directed     sertraline 100 mg tablet Take as directed     tamsulosin 0.4 mg capsule Take as directed     Toujeo SoloStar 300 unit/mL (1.5 mL) subcutaneous insulin pen Use as directed     Tradjenta 5 mg tablet Take as directed     Vitamin B-12 Take as directed     VITAMIN D-400 Take as directed       ALLERGIES: Ingredient Reaction Medication Name Comment  NO KNOWN ALLERGIES     No known allergies.    Vitals Date Temp F BP Pulse Ht In Wt Lb BMI BSA Pain Score  04/04/2016  120/71 74 73 260 34.3  0/10      IMPRESSION The patient has done very well with his left sided DBS placement and would like to schedule the same procedure on the right side for his left hand tremor. He would like to have it done in January 2018, so we will get back to him with an exact date as soon as possible.   Completed Orders (this encounter) Order Details Reason Side Interpretation Result Initial Treatment Date Region  Dietary management  education, guidance, and counseling Encouraged to eat a well balanced diet and follow up with primary care physician.         Assessment/Plan # Detail Type Description   1. Assessment Body mass index (BMI) 34.0-34.9, adult (Z68.34).   Plan Orders Today's instructions / counseling include(s) Dietary management education, guidance, and counseling.         Pain Assessment/Treatment Pain Scale: 0/10. Method: Numeric Pain Intensity Scale. Onset: 07/31/2015.  Schedule right sided DBS placement for January 2018. Nurse education given.   Orders: Instruction(s)/Education: Assessment Instruction  [phone] Dietary management education, guidance, and counseling             Provider:  Marchia Meiers. Vertell Limber MD  04/04/2016 01:09 PM Dictation edited by: Johnella Moloney    CC Providers: Alonza Bogus [address] [place], [place] [address]              Electronically signed by Marchia Meiers. Vertell Limber MD on  04/04/2016 04:30 PM

## 2016-06-17 ENCOUNTER — Ambulatory Visit (HOSPITAL_COMMUNITY): Payer: Medicare Other

## 2016-06-23 ENCOUNTER — Encounter (HOSPITAL_COMMUNITY): Payer: Self-pay | Admitting: *Deleted

## 2016-06-23 MED ORDER — DEXTROSE 5 % IV SOLN
3.0000 g | INTRAVENOUS | Status: DC
  Filled 2016-06-23: qty 3000

## 2016-06-23 NOTE — Anesthesia Preprocedure Evaluation (Addendum)
Anesthesia Evaluation  Patient identified by MRN, date of birth, ID band Patient awake    Reviewed: Allergy & Precautions, NPO status , Patient's Chart, lab work & pertinent test results  Airway Mallampati: II  TM Distance: >3 FB Neck ROM: Full    Dental   Pulmonary sleep apnea , former smoker,    breath sounds clear to auscultation       Cardiovascular hypertension, Pt. on medications  Rhythm:Regular Rate:Normal     Neuro/Psych Depression  Neuromuscular disease (Essential tremor)    GI/Hepatic negative GI ROS, Neg liver ROS,   Endo/Other  diabetes, Type 2, Oral Hypoglycemic Agents  Renal/GU Renal disease     Musculoskeletal  (+) Arthritis ,   Abdominal   Peds  Hematology negative hematology ROS (+)   Anesthesia Other Findings   Reproductive/Obstetrics                            Lab Results  Component Value Date   WBC 7.9 06/24/2016   HGB 12.9 (L) 06/24/2016   HCT 38.6 (L) 06/24/2016   MCV 96.7 06/24/2016   PLT 145 (L) 06/24/2016   Lab Results  Component Value Date   CREATININE 1.70 (H) 05/25/2016   BUN 53 (H) 08/07/2015   NA 141 08/07/2015   K 3.9 08/07/2015   CL 107 08/07/2015   CO2 20 (L) 08/07/2015    Anesthesia Physical Anesthesia Plan  ASA: III  Anesthesia Plan: MAC   Post-op Pain Management:    Induction: Intravenous  Airway Management Planned: Natural Airway and Simple Face Mask  Additional Equipment:   Intra-op Plan:   Post-operative Plan:   Informed Consent: I have reviewed the patients History and Physical, chart, labs and discussed the procedure including the risks, benefits and alternatives for the proposed anesthesia with the patient or authorized representative who has indicated his/her understanding and acceptance.     Plan Discussed with: CRNA and Surgeon  Anesthesia Plan Comments:        Anesthesia Quick Evaluation

## 2016-06-24 ENCOUNTER — Inpatient Hospital Stay (HOSPITAL_COMMUNITY): Payer: Medicare Other | Admitting: Anesthesiology

## 2016-06-24 ENCOUNTER — Inpatient Hospital Stay (HOSPITAL_COMMUNITY)
Admission: RE | Admit: 2016-06-24 | Discharge: 2016-06-25 | DRG: 027 | Disposition: A | Discharge status: Home / Self Care | Payer: Medicare Other | Source: Ambulatory Visit | Attending: Neurosurgery | Admitting: Neurosurgery

## 2016-06-24 ENCOUNTER — Encounter (HOSPITAL_COMMUNITY): Payer: Self-pay | Admitting: *Deleted

## 2016-06-24 ENCOUNTER — Encounter (HOSPITAL_COMMUNITY)
Admission: RE | Disposition: A | Discharge status: Home / Self Care | Payer: Self-pay | Source: Ambulatory Visit | Attending: Neurosurgery

## 2016-06-24 DIAGNOSIS — Z7982 Long term (current) use of aspirin: Secondary | ICD-10-CM | POA: Diagnosis not present

## 2016-06-24 DIAGNOSIS — G250 Essential tremor: Secondary | ICD-10-CM | POA: Diagnosis present

## 2016-06-24 DIAGNOSIS — Z7984 Long term (current) use of oral hypoglycemic drugs: Secondary | ICD-10-CM

## 2016-06-24 DIAGNOSIS — Z720 Tobacco use: Secondary | ICD-10-CM

## 2016-06-24 DIAGNOSIS — R251 Tremor, unspecified: Secondary | ICD-10-CM | POA: Diagnosis present

## 2016-06-24 HISTORY — PX: SUBTHALAMIC STIMULATOR INSERTION: SHX5375

## 2016-06-24 HISTORY — DX: Low back pain: M54.5

## 2016-06-24 HISTORY — DX: Urgency of urination: R39.15

## 2016-06-24 HISTORY — DX: Low back pain, unspecified: M54.50

## 2016-06-24 HISTORY — DX: Personal history of colon polyps, unspecified: Z86.0100

## 2016-06-24 HISTORY — DX: Personal history of colonic polyps: Z86.010

## 2016-06-24 HISTORY — DX: Dermatitis, unspecified: L30.9

## 2016-06-24 LAB — CBC
HEMATOCRIT: 38.6 % — AB (ref 39.0–52.0)
HEMOGLOBIN: 12.9 g/dL — AB (ref 13.0–17.0)
MCH: 32.3 pg (ref 26.0–34.0)
MCHC: 33.4 g/dL (ref 30.0–36.0)
MCV: 96.7 fL (ref 78.0–100.0)
Platelets: 145 10*3/uL — ABNORMAL LOW (ref 150–400)
RBC: 3.99 MIL/uL — AB (ref 4.22–5.81)
RDW: 15.4 % (ref 11.5–15.5)
WBC: 7.9 10*3/uL (ref 4.0–10.5)

## 2016-06-24 LAB — BASIC METABOLIC PANEL
Anion gap: 13 (ref 5–15)
BUN: 47 mg/dL — ABNORMAL HIGH (ref 6–20)
CHLORIDE: 106 mmol/L (ref 101–111)
CO2: 21 mmol/L — AB (ref 22–32)
Calcium: 9.8 mg/dL (ref 8.9–10.3)
Creatinine, Ser: 1.75 mg/dL — ABNORMAL HIGH (ref 0.61–1.24)
GFR calc non Af Amer: 36 mL/min — ABNORMAL LOW (ref >60)
GFR, EST AFRICAN AMERICAN: 42 mL/min — AB (ref >60)
Glucose, Bld: 138 mg/dL — ABNORMAL HIGH (ref 65–99)
POTASSIUM: 4 mmol/L (ref 3.5–5.1)
SODIUM: 140 mmol/L (ref 135–145)

## 2016-06-24 LAB — GLUCOSE, CAPILLARY
GLUCOSE-CAPILLARY: 162 mg/dL — AB (ref 65–99)
GLUCOSE-CAPILLARY: 156 mg/dL — AB (ref 65–99)
GLUCOSE-CAPILLARY: 139 mg/dL — AB (ref 65–99)
GLUCOSE-CAPILLARY: 134 mg/dL — AB (ref 65–99)

## 2016-06-24 SURGERY — SUBTHALAMIC STIMULATOR INSERTION
Anesthesia: Monitor Anesthesia Care | Site: Head | Laterality: Right

## 2016-06-24 MED ORDER — LACTATED RINGERS IV SOLN
INTRAVENOUS | Status: DC | PRN
  Administered 2016-06-24: 07:00:00 via INTRAVENOUS

## 2016-06-24 MED ORDER — CEFAZOLIN SODIUM 10 G IJ SOLR
INTRAMUSCULAR | Status: DC | PRN
  Administered 2016-06-24: 3 g via INTRAVENOUS

## 2016-06-24 MED ORDER — SERTRALINE HCL 50 MG PO TABS: 100.0000 mg | ORAL_TABLET | Freq: Every day | ORAL | Status: DC

## 2016-06-24 MED ORDER — BUPIVACAINE HCL (PF) 0.5 % IJ SOLN
INTRAMUSCULAR | Status: AC
  Filled 2016-06-24: qty 30

## 2016-06-24 MED ORDER — SODIUM BICARBONATE 4 % IV SOLN
INTRAVENOUS | Status: DC | PRN
  Administered 2016-06-24: 3 mL via SUBCUTANEOUS

## 2016-06-24 MED ORDER — 0.9 % SODIUM CHLORIDE (POUR BTL) OPTIME
TOPICAL | Status: DC | PRN
  Administered 2016-06-24: 1000 mL

## 2016-06-24 MED ORDER — HYDROCODONE-ACETAMINOPHEN 5-325 MG PO TABS
1.0000 | ORAL_TABLET | ORAL | Status: DC | PRN
  Administered 2016-06-24: 2 via ORAL
  Administered 2016-06-24: 1 via ORAL
  Administered 2016-06-25 (×2): 2 via ORAL
  Filled 2016-06-24: qty 2
  Filled 2016-06-24 (×2): qty 1
  Filled 2016-06-24: qty 2
  Filled 2016-06-24: qty 1

## 2016-06-24 MED ORDER — CHLORHEXIDINE GLUCONATE CLOTH 2 % EX PADS: 6.0000 | MEDICATED_PAD | Freq: Once | CUTANEOUS | Status: DC

## 2016-06-24 MED ORDER — CEFAZOLIN SODIUM-DEXTROSE 2-4 GM/100ML-% IV SOLN
2.0000 g | Freq: Three times a day (TID) | INTRAVENOUS | Status: AC
  Administered 2016-06-24 (×2): 2 g via INTRAVENOUS
  Filled 2016-06-24 (×2): qty 100

## 2016-06-24 MED ORDER — SODIUM CHLORIDE 0.9% FLUSH
3.0000 mL | Freq: Two times a day (BID) | INTRAVENOUS | Status: DC
  Administered 2016-06-24 (×2): 3 mL via INTRAVENOUS

## 2016-06-24 MED ORDER — HEMOSTATIC AGENTS (NO CHARGE) OPTIME
TOPICAL | Status: DC | PRN
  Administered 2016-06-24 (×3): 1 via TOPICAL

## 2016-06-24 MED ORDER — HYPROMELLOSE (GONIOSCOPIC) 2.5 % OP SOLN
1.0000 [drp] | Freq: Two times a day (BID) | OPHTHALMIC | Status: DC
  Administered 2016-06-24: 1 [drp] via OPHTHALMIC
  Filled 2016-06-24: qty 15

## 2016-06-24 MED ORDER — GLIMEPIRIDE 2 MG PO TABS: 4.0000 mg | ORAL_TABLET | Freq: Every day | ORAL | Status: DC

## 2016-06-24 MED ORDER — BUPIVACAINE HCL 0.5 % IJ SOLN
INTRAMUSCULAR | Status: DC | PRN
  Administered 2016-06-24: 9 mL

## 2016-06-24 MED ORDER — FLEET ENEMA 7-19 GM/118ML RE ENEM: 1.0000 | ENEMA | Freq: Once | RECTAL | Status: DC | PRN

## 2016-06-24 MED ORDER — FINASTERIDE 5 MG PO TABS
5.0000 mg | ORAL_TABLET | Freq: Every day | ORAL | Status: DC
Start: 2016-06-24 — End: 2016-06-25
  Administered 2016-06-24: 5 mg via ORAL
  Filled 2016-06-24: qty 1

## 2016-06-24 MED ORDER — PROPOFOL 10 MG/ML IV BOLUS
INTRAVENOUS | Status: AC
  Filled 2016-06-24: qty 20

## 2016-06-24 MED ORDER — KCL IN DEXTROSE-NACL 20-5-0.45 MEQ/L-%-% IV SOLN: INTRAVENOUS | Status: DC

## 2016-06-24 MED ORDER — ATORVASTATIN CALCIUM 20 MG PO TABS: 40.0000 mg | ORAL_TABLET | Freq: Every day | ORAL | Status: DC

## 2016-06-24 MED ORDER — BACITRACIN ZINC 500 UNIT/GM EX OINT
TOPICAL_OINTMENT | CUTANEOUS | Status: DC | PRN
  Administered 2016-06-24: 1 via TOPICAL

## 2016-06-24 MED ORDER — SODIUM BICARBONATE 4 % IV SOLN
INTRAVENOUS | Status: AC
  Filled 2016-06-24: qty 5

## 2016-06-24 MED ORDER — THROMBIN 5000 UNITS EX SOLR
CUTANEOUS | Status: AC
  Filled 2016-06-24: qty 5000

## 2016-06-24 MED ORDER — ZOLPIDEM TARTRATE 5 MG PO TABS: 5.0000 mg | ORAL_TABLET | Freq: Every evening | ORAL | Status: DC | PRN

## 2016-06-24 MED ORDER — BISACODYL 10 MG RE SUPP: 10.0000 mg | Freq: Every day | RECTAL | Status: DC | PRN

## 2016-06-24 MED ORDER — OXYCODONE-ACETAMINOPHEN 5-325 MG PO TABS: 1.0000 | ORAL_TABLET | ORAL | Status: DC | PRN

## 2016-06-24 MED ORDER — VITAMIN B-12 1000 MCG PO TABS
1000.0000 ug | ORAL_TABLET | Freq: Every day | ORAL | Status: DC
  Filled 2016-06-24: qty 1

## 2016-06-24 MED ORDER — BACITRACIN ZINC 500 UNIT/GM EX OINT
TOPICAL_OINTMENT | CUTANEOUS | Status: AC
  Filled 2016-06-24: qty 28.35

## 2016-06-24 MED ORDER — CLOTRIMAZOLE 1 % EX CREA
1.0000 "application " | TOPICAL_CREAM | Freq: Two times a day (BID) | CUTANEOUS | Status: DC
  Administered 2016-06-24: 1 via TOPICAL
  Filled 2016-06-24: qty 15

## 2016-06-24 MED ORDER — GELATIN ABSORBABLE MT POWD
OROMUCOSAL | Status: DC | PRN
  Administered 2016-06-24: 5 mL via TOPICAL

## 2016-06-24 MED ORDER — ACETAMINOPHEN 650 MG RE SUPP
650.0000 mg | RECTAL | Status: DC | PRN
Start: 2016-06-24 — End: 2016-06-25

## 2016-06-24 MED ORDER — LABETALOL HCL 100 MG PO TABS
100.0000 mg | ORAL_TABLET | Freq: Two times a day (BID) | ORAL | Status: DC
  Administered 2016-06-24: 100 mg via ORAL
  Filled 2016-06-24 (×2): qty 1

## 2016-06-24 MED ORDER — LINAGLIPTIN 5 MG PO TABS
5.0000 mg | ORAL_TABLET | Freq: Every day | ORAL | Status: DC
  Administered 2016-06-24: 5 mg via ORAL
  Filled 2016-06-24 (×2): qty 1

## 2016-06-24 MED ORDER — LABETALOL HCL 100 MG PO TABS
100.0000 mg | ORAL_TABLET | ORAL | Status: AC
  Administered 2016-06-24: 100 mg via ORAL
  Filled 2016-06-24: qty 1

## 2016-06-24 MED ORDER — LOSARTAN POTASSIUM 50 MG PO TABS: 100.0000 mg | ORAL_TABLET | Freq: Every day | ORAL | Status: DC

## 2016-06-24 MED ORDER — DEXMEDETOMIDINE HCL IN NACL 200 MCG/50ML IV SOLN
INTRAVENOUS | Status: DC | PRN
  Administered 2016-06-24: .1 ug/kg/h via INTRAVENOUS

## 2016-06-24 MED ORDER — ASPIRIN EFFERVESCENT 325 MG PO TBEF: 325.0000 mg | EFFERVESCENT_TABLET | Freq: Four times a day (QID) | ORAL | Status: DC | PRN

## 2016-06-24 MED ORDER — DEXMEDETOMIDINE HCL IN NACL 200 MCG/50ML IV SOLN
INTRAVENOUS | Status: AC
  Filled 2016-06-24: qty 50

## 2016-06-24 MED ORDER — THROMBIN 5000 UNITS EX SOLR
CUTANEOUS | Status: DC | PRN
  Administered 2016-06-24 (×2): 5000 [IU] via TOPICAL

## 2016-06-24 MED ORDER — MENTHOL 3 MG MT LOZG: 1.0000 | LOZENGE | OROMUCOSAL | Status: DC | PRN

## 2016-06-24 MED ORDER — PROSIGHT PO TABS
1.0000 | ORAL_TABLET | Freq: Every day | ORAL | Status: DC
  Filled 2016-06-24: qty 1

## 2016-06-24 MED ORDER — AMLODIPINE BESYLATE 10 MG PO TABS
10.0000 mg | ORAL_TABLET | Freq: Every day | ORAL | Status: DC
  Filled 2016-06-24: qty 1

## 2016-06-24 MED ORDER — LIDOCAINE-EPINEPHRINE (PF) 2 %-1:200000 IJ SOLN
INTRAMUSCULAR | Status: DC | PRN
  Administered 2016-06-24: 9 mL

## 2016-06-24 MED ORDER — HYDROCODONE-ACETAMINOPHEN 5-325 MG PO TABS: 1.0000 | ORAL_TABLET | ORAL | Status: DC | PRN

## 2016-06-24 MED ORDER — FENTANYL CITRATE (PF) 100 MCG/2ML IJ SOLN: 25.0000 ug | INTRAMUSCULAR | Status: DC | PRN

## 2016-06-24 MED ORDER — SODIUM CHLORIDE 0.9% FLUSH: 3.0000 mL | INTRAVENOUS | Status: DC | PRN

## 2016-06-24 MED ORDER — DOCUSATE SODIUM 100 MG PO CAPS
100.0000 mg | ORAL_CAPSULE | Freq: Two times a day (BID) | ORAL | Status: DC
  Administered 2016-06-24 (×2): 100 mg via ORAL
  Filled 2016-06-24 (×2): qty 1

## 2016-06-24 MED ORDER — LOSARTAN POTASSIUM-HCTZ 100-25 MG PO TABS: 1.0000 | ORAL_TABLET | Freq: Every day | ORAL | Status: DC

## 2016-06-24 MED ORDER — ONDANSETRON HCL 4 MG/2ML IJ SOLN: 4.0000 mg | INTRAMUSCULAR | Status: DC | PRN

## 2016-06-24 MED ORDER — MORPHINE SULFATE (PF) 4 MG/ML IV SOLN: 1.0000 mg | INTRAVENOUS | Status: DC | PRN

## 2016-06-24 MED ORDER — INSULIN GLARGINE 100 UNIT/ML ~~LOC~~ SOLN
56.0000 [IU] | Freq: Every day | SUBCUTANEOUS | Status: DC
  Administered 2016-06-24: 56 [IU] via SUBCUTANEOUS
  Filled 2016-06-24: qty 0.56

## 2016-06-24 MED ORDER — PHENOL 1.4 % MT LIQD
1.0000 | OROMUCOSAL | Status: DC | PRN
Start: 2016-06-24 — End: 2016-06-25

## 2016-06-24 MED ORDER — CANAGLIFLOZIN 100 MG PO TABS
100.0000 mg | ORAL_TABLET | Freq: Every day | ORAL | Status: DC
  Administered 2016-06-25: 100 mg via ORAL
  Filled 2016-06-24: qty 1

## 2016-06-24 MED ORDER — HYDROCHLOROTHIAZIDE 25 MG PO TABS: 25.0000 mg | ORAL_TABLET | Freq: Every day | ORAL | Status: DC

## 2016-06-24 MED ORDER — TAMSULOSIN HCL 0.4 MG PO CAPS
0.4000 mg | ORAL_CAPSULE | Freq: Every day | ORAL | Status: DC
  Administered 2016-06-24: 0.4 mg via ORAL
  Filled 2016-06-24: qty 1

## 2016-06-24 MED ORDER — PIMECROLIMUS 1 % EX CREA: 1.0000 "application " | TOPICAL_CREAM | Freq: Every day | CUTANEOUS | Status: DC | PRN

## 2016-06-24 MED ORDER — GEMFIBROZIL 600 MG PO TABS
600.0000 mg | ORAL_TABLET | Freq: Two times a day (BID) | ORAL | Status: DC
  Administered 2016-06-25: 600 mg via ORAL
  Filled 2016-06-24: qty 1

## 2016-06-24 MED ORDER — GABAPENTIN 600 MG PO TABS
600.0000 mg | ORAL_TABLET | Freq: Two times a day (BID) | ORAL | Status: DC
  Administered 2016-06-24: 600 mg via ORAL
  Filled 2016-06-24: qty 1

## 2016-06-24 MED ORDER — LIDOCAINE-EPINEPHRINE (PF) 2 %-1:200000 IJ SOLN
INTRAMUSCULAR | Status: AC
  Filled 2016-06-24: qty 20

## 2016-06-24 MED ORDER — SENNOSIDES-DOCUSATE SODIUM 8.6-50 MG PO TABS: 1.0000 | ORAL_TABLET | Freq: Every evening | ORAL | Status: DC | PRN

## 2016-06-24 MED ORDER — ACETAMINOPHEN 325 MG PO TABS: 650.0000 mg | ORAL_TABLET | ORAL | Status: DC | PRN

## 2016-06-24 SURGICAL SUPPLY — 68 items
BANDAGE ADH SHEER 1  50/CT (GAUZE/BANDAGES/DRESSINGS) ×12 IMPLANT
BIT DRILL NEURO 2X3.1 SFT TUCH (MISCELLANEOUS) IMPLANT
BLADE CLIPPER SURG (BLADE) ×3 IMPLANT
BLADE SURG 11 STRL SS (BLADE) ×6 IMPLANT
BNDG GAUZE ELAST 4 BULKY (GAUZE/BANDAGES/DRESSINGS) ×6 IMPLANT
BOOT SUTURE AID YELLOW STND (SUTURE) IMPLANT
CABLE MER (MISCELLANEOUS) ×3 IMPLANT
CANISTER SUCT 3000ML PPV (MISCELLANEOUS) ×3 IMPLANT
CARTRIDGE OIL MAESTRO DRILL (MISCELLANEOUS) ×1 IMPLANT
CLIP RANEY DISP (INSTRUMENTS) ×3 IMPLANT
DECANTER SPIKE VIAL GLASS SM (MISCELLANEOUS) ×3 IMPLANT
DIFFUSER DRILL AIR PNEUMATIC (MISCELLANEOUS) ×3 IMPLANT
DRAPE POUCH INSTRU U-SHP 10X18 (DRAPES) ×3 IMPLANT
DRAPE STERI IOBAN 125X83 (DRAPES) IMPLANT
DRESSING OPSITE X SMALL 2X3 (GAUZE/BANDAGES/DRESSINGS) ×9 IMPLANT
DRILL NEURO 2X3.1 SOFT TOUCH (MISCELLANEOUS)
DRSG OPSITE POSTOP 4X6 (GAUZE/BANDAGES/DRESSINGS) ×3 IMPLANT
DURAPREP 26ML APPLICATOR (WOUND CARE) ×3 IMPLANT
DURAPREP 6ML APPLICATOR 50/CS (WOUND CARE) IMPLANT
DURASEAL APPLICATOR TIP (TIP) ×6 IMPLANT
DURASEAL SPINE SEALANT 3ML (MISCELLANEOUS) ×6 IMPLANT
ELECT CAUTERY BLADE 6.4 (BLADE) ×3 IMPLANT
GAUZE SPONGE 4X4 12PLY STRL (GAUZE/BANDAGES/DRESSINGS) ×3 IMPLANT
GAUZE SPONGE 4X4 16PLY XRAY LF (GAUZE/BANDAGES/DRESSINGS) IMPLANT
GLOVE BIO SURGEON STRL SZ8 (GLOVE) ×6 IMPLANT
GLOVE BIOGEL PI IND STRL 8 (GLOVE) ×2 IMPLANT
GLOVE BIOGEL PI IND STRL 8.5 (GLOVE) ×2 IMPLANT
GLOVE BIOGEL PI INDICATOR 8 (GLOVE) ×4
GLOVE BIOGEL PI INDICATOR 8.5 (GLOVE) ×4
GLOVE ECLIPSE 8.0 STRL XLNG CF (GLOVE) ×6 IMPLANT
GOWN STRL REUS W/ TWL LRG LVL3 (GOWN DISPOSABLE) ×1 IMPLANT
GOWN STRL REUS W/ TWL XL LVL3 (GOWN DISPOSABLE) IMPLANT
GOWN STRL REUS W/TWL 2XL LVL3 (GOWN DISPOSABLE) ×6 IMPLANT
GOWN STRL REUS W/TWL LRG LVL3 (GOWN DISPOSABLE) ×2
GOWN STRL REUS W/TWL XL LVL3 (GOWN DISPOSABLE)
HEAD BAND HEADREST 4345 6 (MISCELLANEOUS) ×3 IMPLANT
HEMOSTAT POWDER KIT SURGIFOAM (HEMOSTASIS) ×3 IMPLANT
KIT ACCESSORY (KITS) ×2
KIT ACCESSORY NEUROSURG SU (KITS) ×1 IMPLANT
KIT BASIN OR (CUSTOM PROCEDURE TRAY) ×3 IMPLANT
KIT PLATFORM UNI 4LEG STARFIX (KITS) ×3 IMPLANT
KIT ROOM TURNOVER OR (KITS) ×3 IMPLANT
LEAD DBS (Neuro Prosthesis/Implant) ×3 IMPLANT
MARKER SKIN DUAL TIP RULER LAB (MISCELLANEOUS) ×6 IMPLANT
NEEDLE HYPO 25X1 1.5 SAFETY (NEEDLE) ×6 IMPLANT
NEEDLE SPNL 18GX3.5 QUINCKE PK (NEEDLE) IMPLANT
NEEDLE SPNL 22GX3.5 QUINCKE BK (NEEDLE) IMPLANT
NS IRRIG 1000ML POUR BTL (IV SOLUTION) ×3 IMPLANT
OIL CARTRIDGE MAESTRO DRILL (MISCELLANEOUS) ×3
PACK LAMINECTOMY NEURO (CUSTOM PROCEDURE TRAY) ×3 IMPLANT
PAD ARMBOARD 7.5X6 YLW CONV (MISCELLANEOUS) ×12 IMPLANT
PERFORATOR LRG  14-11MM (BIT) ×2
PERFORATOR LRG 14-11MM (BIT) ×1 IMPLANT
PLATEFORM ARRAY DZAP LEADPOINT (MISCELLANEOUS) ×2
PLATFORM ARRAY DZAP LEADPOINT (MISCELLANEOUS) ×1 IMPLANT
SET SINGLE IT STRL (KITS) ×3 IMPLANT
SPONGE SURGIFOAM ABS GEL SZ50 (HEMOSTASIS) ×3 IMPLANT
STAPLER SKIN PROX WIDE 3.9 (STAPLE) ×3 IMPLANT
SUT ETHILON 3 0 PS 1 (SUTURE) IMPLANT
SUT SILK 2 0 TIES 10X30 (SUTURE) ×3 IMPLANT
SUT VIC AB 2-0 CP2 18 (SUTURE) ×3 IMPLANT
SYR CONTROL 10ML LL (SYRINGE) ×6 IMPLANT
TOWEL OR 17X24 6PK STRL BLUE (TOWEL DISPOSABLE) ×3 IMPLANT
TOWEL OR 17X26 10 PK STRL BLUE (TOWEL DISPOSABLE) ×3 IMPLANT
TRAY FOLEY W/METER SILVER 16FR (SET/KITS/TRAYS/PACK) IMPLANT
TUBE CONNECTING 12'X1/4 (SUCTIONS) ×1
TUBE CONNECTING 12X1/4 (SUCTIONS) ×2 IMPLANT
WATER STERILE IRR 1000ML POUR (IV SOLUTION) ×3 IMPLANT

## 2016-06-24 NOTE — Interval H&P Note (Signed)
History and Physical Interval Note:  06/24/2016 7:31 AM  Logan Griffith  has presented today for surgery, with the diagnosis of ESSENTIAL TREMOR  The various methods of treatment have been discussed with the patient and family. After consideration of risks, benefits and other options for treatment, the patient has consented to  Procedure(s) with comments: [place] (Right) - [place] as a surgical intervention .  The patient's history has been reviewed, patient examined, no change in status, stable for surgery.  I have reviewed the patient's chart and labs.  Questions were answered to the patient's satisfaction.     Daaiyah Baumert D

## 2016-06-24 NOTE — Evaluation (Signed)
Physical Therapy Evaluation Patient Details Name: Logan Griffith MRN: [id number] DOB: 1941/06/05 Today's Date: 06/24/2016   History of Present Illness  Pt is a 76 y/o male s/p R deep brain stimulator (DBS) placement. PMH including but not limited to L DBS placement (08/2015), HTN, DM and L ankle fusion.  Clinical Impression  Pt presented supine in bed with HOB elevated, awake and willing to participate in therapy session. Prior to admission, pt reported that he was mod I with all functional mobility with use of SPC PRN with ambulation. Pt currently performing bed mobility at mod I level, transfers and ambulation with supervision for safety. No further acute PT needs identified at this time. PT signing off.     Follow Up Recommendations No PT follow up;Supervision for mobility/OOB    Equipment Recommendations  None recommended by PT    Recommendations for Other Services       Precautions / Restrictions Precautions Precautions: Fall Restrictions Weight Bearing Restrictions: No      Mobility  Bed Mobility Overal bed mobility: Modified Independent             General bed mobility comments: increased time  Transfers Overall transfer level: Needs assistance Equipment used: Straight cane Transfers: Sit to/from Stand Sit to Stand: Supervision         General transfer comment: pt required increased time to gain stability upon standing, SPC used in R hand  Ambulation/Gait Ambulation/Gait assistance: Min guard;Supervision Ambulation Distance (Feet): 200 Feet Assistive device: Straight cane Gait Pattern/deviations: Step-through pattern;Decreased step length - right;Decreased step length - left;Decreased stride length;Decreased dorsiflexion - left;Decreased stance time - left;Wide base of support Gait velocity: decreased Gait velocity interpretation: Below normal speed for age/gender General Gait Details: pt reported a "limp" at baseline secondary to L ankle fusion. pt  progressing from min guard to supervision. No LOB or need for physical assistance.  Stairs            Wheelchair Mobility    Modified Rankin (Stroke Patients Only)       Balance Overall balance assessment: Needs assistance Sitting-balance support: Feet supported;No upper extremity supported Sitting balance-Leahy Scale: Good     Standing balance support: During functional activity;Single extremity supported Standing balance-Leahy Scale: Poor Standing balance comment: pt reliant on SPC or external support                             Pertinent Vitals/Pain Pain Assessment: 0-10 Pain Score: 3  Pain Location: incision site Pain Descriptors / Indicators: Sore Pain Intervention(s): Monitored during session    Home Living Family/patient expects to be discharged to:: Private residence Living Arrangements: Spouse/significant other Available Help at Discharge: Family;Available 24 hours/day Type of Home: House Home Access: Stairs to enter Entrance Stairs-Rails: Psychiatric nurse of Steps: 3 Home Layout: Two level;Laundry or work area in basement;Able to live on main level with bedroom/bathroom Home Equipment: [name] - single point      Prior Function Level of Independence: Independent with assistive device(s)         Comments: pt reported that he uses a SPC to ambulate with PRN     Hand Dominance   Dominant Hand: Right    Extremity/Trunk Assessment   Upper Extremity Assessment Upper Extremity Assessment: Defer to OT evaluation    Lower Extremity Assessment Lower Extremity Assessment: Overall WFL for tasks assessed       Communication   Communication: No difficulties  Cognition Arousal/Alertness: Awake/alert  Behavior During Therapy: WFL for tasks assessed/performed Overall Cognitive Status: Within Functional Limits for tasks assessed                      General Comments      Exercises     Assessment/Plan    PT  Assessment Patent does not need any further PT services  PT Problem List            PT Treatment Interventions      PT Goals (Current goals can be found in the Care Plan section)  Acute Rehab PT Goals Patient Stated Goal: return home tomorrow    Frequency     Barriers to discharge        Co-evaluation               End of Session Equipment Utilized During Treatment: Gait belt Activity Tolerance: Patient tolerated treatment well Patient left: in chair;with call bell/phone within reach;with nursing/sitter in room;Other (comment) (nurse tech in room) Nurse Communication: Mobility status         Time: [id number] PT Time Calculation (min) (ACUTE ONLY): 16 min   Charges:   PT Evaluation $PT Eval Moderate Complexity: 1 Procedure     PT G CodesClearnce Sorrel Eljay Lave 06/24/2016, 3:54 PM Sherie Don, [place], DPT [phone]

## 2016-06-24 NOTE — Brief Op Note (Signed)
06/24/2016  11:10 AM  PATIENT:  Logan Griffith  76 y.o. male  PRE-OPERATIVE DIAGNOSIS:  ESSENTIAL TREMOR  POST-OPERATIVE DIAGNOSIS:  ESSENTIAL TREMOR  PROCEDURE:  Procedure(s) with comments: RIGHT DEEP BRAIN STIMULATOR PLACEMENT (Right) - RIGHT DEEP BRAIN STIMULATOR PLACEMENT with Starfix and microelectrode recordings  SURGEON:  Surgeon(s) and Role:    * Erline Levine, MD - Primary  PHYSICIAN ASSISTANT:   ASSISTANTS: Poteat, RN   ANESTHESIA:   IV sedation  EBL:  Total I/O In: 400 [I.V.:400] Out: 75 [Blood:75]  BLOOD ADMINISTERED:none  DRAINS: none   LOCAL MEDICATIONS USED:  MARCAINE    and LIDOCAINE   SPECIMEN:  No Specimen  DISPOSITION OF SPECIMEN:  N/A  COUNTS:  YES  TOURNIQUET:  * No tourniquets in log *  DICTATION: Indications: Patient is a 76 year old man with Essential Tremor.  He has had successful DBS on the left and now presents for right sided DBS VIM for tremor.  it was elected to take him to surgery for right VIM Thalamus deep brain stimulator electrode placement.  Procedure: Preoperative planing was performed with volumetricCT and placement of 4 fiducial markers in the skull followed by CT of the brain also obtained volumetrically. These were then exported to create Starfix head frame with planned targeting of VIM nucleus electrodes was performed. The patient was brought to the operating room and placed in a semi-Fowler's position with his neck and stabilized in a neck holder. This was affixed to the [place]field adapter. His scalp was then prepped with DuraPrep and subsequently draped with an Ioban drape.with the fiducials and the skin was infiltrated with local lidocaine.  The areas of planned incision were then infiltrated with local lidocaine with epinephrine. The stepoffs were connected and the Starfix frame was assembled.  The entry points were then marked.  A curvilinear incision was made centered on the right entry point. An elevator was used to clear  pericranium from the skull. The perforator it was then used to produce a 14 mm bur hole. The dura was coagulated with bipolar electrocautery.  After opening the dura and a localizing the entry point the stylette and outer cannula were inserted into the brain. Duraseal was placed to prevent CSF leakage at the entry site. Microelectrode recordings were then performed and  we had 4 mm recordings from the VIM on the initial pass.  We elected to take a more medial trajectory and with this, we had 9 mm of VIM recordings with better tremor activation. Subsequently the stimulating electrode was placed and the patient had significant improvement in tremor on the left side of the body without significant side effects to higher voltages. The electrode was then locked into position with the stimlock cap. The redundant electrode was circularized and tunneled under the scalp. The electrode was tunneled to the right and then into the posterior scalp and locked into position. The wounds were then irrigated and closed with 2-0 Vicryl sutures and staples. The fiducials were removed and Nylon stitches were placed over each of these sites. The head was washed and then sterile occlusive dressings were placed. The patient was taken to recovery having tolerated his procedure without difficulty or untoward effect. Please refer to detailed microelectrode recordings from Dr. Carles Collet for more specifics of the positioning of the electrodes. These are included in her Epic note from the detailed neural monitoring and physical exam assessment during the surgery.   PLAN OF CARE: Admit to inpatient   PATIENT DISPOSITION:  PACU - hemodynamically stable.  Delay start of Pharmacological VTE agent (>24hrs) due to surgical blood loss or risk of bleeding: yes

## 2016-06-24 NOTE — Progress Notes (Signed)
Subjective: Patient reports doing well  Objective: Vital signs in last 24 hours: Temp:  [97.7 F (36.5 C)-98.6 F (37 C)] 97.7 F (36.5 C) (01/19 1105) Pulse Rate:  [89] 89 (01/19 0635) Resp:  [18] 18 (01/19 0635) BP: (150)/(68) 150/68 (01/19 0635) SpO2:  [93 %] 93 % (01/19 0635) Weight:  [120.2 kg (265 lb)] 120.2 kg (265 lb) (01/19 0711)  Intake/Output from previous day: No intake/output data recorded. Intake/Output this shift: Total I/O In: 400 [I.V.:400] Out: 75 [Blood:75]  Physical Exam: Awake, alert, conversant.  MAEW.  No drift.  PERRL, EOMI.  Dressings CDI.  Lab Results:  Recent Labs  06/24/16 0719  WBC 7.9  HGB 12.9*  HCT 38.6*  PLT 145*   BMET  Recent Labs  06/24/16 0719  NA 140  K 4.0  CL 106  CO2 21*  GLUCOSE 138*  BUN 47*  CREATININE 1.75*  CALCIUM 9.8    Studies/Results: No results found.  Assessment/Plan: Doing well initially postop.  Observe, mobilize as tolerated.    LOS: 0 days    Logan Shoals, MD 06/24/2016, 11:39 AM

## 2016-06-24 NOTE — H&P (View-Only) (Signed)
Patient ID:   [id number] Patient: Logan Griffith  Date of Birth: July 15, 1940 Visit Type: Office Visit   Date: 04/04/2016 12:15 PM Provider: Marchia Meiers. Vertell Limber MD   This 76 year old male presents for Tremor.  History of Present Illness: 1.  Tremor    Left DBS placed in February and March 2017, performing well.  Patient hopes to schedule right DBS for January.      Medical/Surgical/Interim History Reviewed, no change.  Last detailed document date:05/18/2015.   PAST MEDICAL HISTORY, SURGICAL HISTORY, FAMILY HISTORY, SOCIAL HISTORY AND REVIEW OF SYSTEMS  05/18/2015, which I have signed.  Family History: Reviewed, no changes.  Last detailed document: 05/18/2015.   Social History: Tobacco use reviewed. Reviewed, no changes. Last detailed document date: 05/18/2015.      MEDICATIONS(added, continued or stopped this visit): Started Medication Directions Instruction Stopped   amlodipine 10 mg tablet take 1 tablet by oral route  every day     aspirin 81 mg chewable tablet chew 1 tablet by oral route  every day     Centrum Silver Take once daily     Cinnamon 500 mg capsule Take as directed     flaxseed oil 1,000 mg capsule Take once daily     gabapentin 600 mg tablet take 1 tablet by oral route 2 times every day     garlic Take as directed     gemfibrozil 600 mg tablet Take as directed     glimepiride 4 mg tablet Take as directed     HYDROCODONE/ACETAMINOPHEN take 1 - 2 tablets by oral route  every 6 hours as needed for pain     Jardiance 25 mg tablet Take as directed     labetalol 300 mg tablet take 1 tablet by oral route 2 times every day     Lipitor 40 mg tablet Take as directed     losartan 100 mg-hydrochlorothiazide 25 mg tablet take 1 tablet by oral route  every day     MSM 1,000 mg tablet Take as directed     OCUVITE LUTEIN & ZEAXANTHIN Take once daily     oxybutynin chloride 5 mg tablet Take as directed     potassium 99 mg tablet Take as directed     primidone 250 mg  tablet Take as directed     probiotic Take as directed     sertraline 100 mg tablet Take as directed     tamsulosin 0.4 mg capsule Take as directed     Toujeo SoloStar 300 unit/mL (1.5 mL) subcutaneous insulin pen Use as directed     Tradjenta 5 mg tablet Take as directed     Vitamin B-12 Take as directed     VITAMIN D-400 Take as directed       ALLERGIES: Ingredient Reaction Medication Name Comment  NO KNOWN ALLERGIES     No known allergies.    Vitals Date Temp F BP Pulse Ht In Wt Lb BMI BSA Pain Score  04/04/2016  120/71 74 73 260 34.3  0/10      IMPRESSION The patient has done very well with his left sided DBS placement and would like to schedule the same procedure on the right side for his left hand tremor. He would like to have it done in January 2018, so we will get back to him with an exact date as soon as possible.   Completed Orders (this encounter) Order Details Reason Side Interpretation Result Initial Treatment Date Region  Dietary management  education, guidance, and counseling Encouraged to eat a well balanced diet and follow up with primary care physician.         Assessment/Plan # Detail Type Description   1. Assessment Body mass index (BMI) 34.0-34.9, adult (Z68.34).   Plan Orders Today's instructions / counseling include(s) Dietary management education, guidance, and counseling.         Pain Assessment/Treatment Pain Scale: 0/10. Method: Numeric Pain Intensity Scale. Onset: 07/31/2015.  Schedule right sided DBS placement for January 2018. Nurse education given.   Orders: Instruction(s)/Education: Assessment Instruction  [phone] Dietary management education, guidance, and counseling             Provider:  Marchia Meiers. Vertell Limber MD  04/04/2016 01:09 PM Dictation edited by: Johnella Moloney    CC Providers: Alonza Bogus [address] [place],  [phone]              Electronically signed by Marchia Meiers. Vertell Limber MD on  04/04/2016 04:30 PM

## 2016-06-24 NOTE — Op Note (Signed)
06/24/2016  11:10 AM  PATIENT:  Logan Griffith  76 y.o. male  PRE-OPERATIVE DIAGNOSIS:  ESSENTIAL TREMOR  POST-OPERATIVE DIAGNOSIS:  ESSENTIAL TREMOR  PROCEDURE:  Procedure(s) with comments: RIGHT DEEP BRAIN STIMULATOR PLACEMENT (Right) - RIGHT DEEP BRAIN STIMULATOR PLACEMENT with Starfix and microelectrode recordings  SURGEON:  Surgeon(s) and Role:    * Erline Levine, MD - Primary  PHYSICIAN ASSISTANT:   ASSISTANTS: Poteat, RN   ANESTHESIA:   IV sedation  EBL:  Total I/O In: 400 [I.V.:400] Out: 75 [Blood:75]  BLOOD ADMINISTERED:none  DRAINS: none   LOCAL MEDICATIONS USED:  MARCAINE    and LIDOCAINE   SPECIMEN:  No Specimen  DISPOSITION OF SPECIMEN:  N/A  COUNTS:  YES  TOURNIQUET:  * No tourniquets in log *  DICTATION: Indications: Patient is a 76 year old man with Essential Tremor.  He has had successful DBS on the left and now presents for right sided DBS VIM for tremor.  it was elected to take him to surgery for right VIM Thalamus deep brain stimulator electrode placement.  Procedure: Preoperative planing was performed with volumetricCT and placement of 4 fiducial markers in the skull followed by CT of the brain also obtained volumetrically. These were then exported to create Starfix head frame with planned targeting of VIM nucleus electrodes was performed. The patient was brought to the operating room and placed in a semi-Fowler's position with his neck and stabilized in a neck holder. This was affixed to the [place] adapter. His scalp was then prepped with DuraPrep and subsequently draped with an Ioban drape.with the fiducials and the skin was infiltrated with local lidocaine.  The areas of planned incision were then infiltrated with local lidocaine with epinephrine. The stepoffs were connected and the Starfix frame was assembled.  The entry points were then marked.  A curvilinear incision was made centered on the right entry point. An elevator was used to clear  pericranium from the skull. The perforator it was then used to produce a 14 mm bur hole. The dura was coagulated with bipolar electrocautery.  After opening the dura and a localizing the entry point the stylette and outer cannula were inserted into the brain. Duraseal was placed to prevent CSF leakage at the entry site. Microelectrode recordings were then performed and  we had 4 mm recordings from the VIM on the initial pass.  We elected to take a more medial trajectory and with this, we had 9 mm of VIM recordings with better tremor activation. Subsequently the stimulating electrode was placed and the patient had significant improvement in tremor on the left side of the body without significant side effects to higher voltages. The electrode was then locked into position with the stimlock cap. The redundant electrode was circularized and tunneled under the scalp. The electrode was tunneled to the right and then into the posterior scalp and locked into position. The wounds were then irrigated and closed with 2-0 Vicryl sutures and staples. The fiducials were removed and Nylon stitches were placed over each of these sites. The head was washed and then sterile occlusive dressings were placed. The patient was taken to recovery having tolerated his procedure without difficulty or untoward effect. Please refer to detailed microelectrode recordings from Dr. Carles Collet for more specifics of the positioning of the electrodes. These are included in her Epic note from the detailed neural monitoring and physical exam assessment during the surgery.   PLAN OF CARE: Admit to inpatient   PATIENT DISPOSITION:  PACU - hemodynamically stable.  Delay start of Pharmacological VTE agent (>24hrs) due to surgical blood loss or risk of bleeding: yes

## 2016-06-24 NOTE — Procedures (Signed)
Preoperative diagnosis:  Essential Tremor Postoperative diagnosis:  same Surgeon:  Erline Levine Neurologist:  Wells Guiles Tat  CPT codes: [phone] Indication for procedure: Determination of optimal electrode position for deep brain stimulation therapy. Complications: None   Description of procedure:  Following the incision and burr hole placement, a recording microelectrode was slowly advanced into the brain via a motorized drive.  Beginning approximately 10 mm above the target, recordings were periodically made, and the resultant brain activity was described on the neurophysiologic recording worksheet.  Relevant samples of the recordings were saved for subsequent off line analysis.  A total of two recording passes were obtained on the right side of the brain.   On the first pass, only 4 mm of VIM activity was noted with only few tremor cells.  Therefore, the mictroelectrode was repositioned 2 mm medial and beginning 10 mmg of above, recording was performed.    28mm of VIM were recorded from the right side of the brain.  There were robust tremor cells and kinesthetic responses.  Following the recording, the recording electrode was replaced by a stimulating electrode by Dr. Vertell Limber.  Test stimulations were then obtained.  Active contacts that were used and the response to stimulation, including both adverse effects and therapeutic effects, were recorded on the neurophysiologic stimulation worksheet.  In brief, there was near complete resolution of tremor following activation and stimulation.  Adverse effects were not noted.  Alonza Bogus, DO [facility] Director of Movement Disorders

## 2016-06-24 NOTE — Transfer of Care (Signed)
Immediate Anesthesia Transfer of Care Note  Patient: Logan Griffith  Procedure(s) Performed: Procedure(s) with comments: RIGHT DEEP BRAIN STIMULATOR PLACEMENT (Right) - RIGHT DEEP BRAIN STIMULATOR PLACEMENT  Patient Location: PACU  Anesthesia Type:MAC  Level of Consciousness: awake, alert , oriented and patient cooperative  Airway & Oxygen Therapy: Patient Spontanous Breathing and Patient connected to nasal cannula oxygen  Post-op Assessment: Report given to RN and Post -op Vital signs reviewed and stable  Post vital signs: Reviewed and stable  Last Vitals:  Vitals:   06/24/16 0635 06/24/16 1105  BP: (!) 150/68   Pulse: 89   Resp: 18   Temp: 37 C (P) 36.5 C    Last Pain:  Vitals:   06/24/16 0711  TempSrc:   PainSc: 7       Patients Stated Pain Goal: 7 (14/23/95 3202)  Complications: No apparent anesthesia complications

## 2016-06-24 NOTE — Anesthesia Postprocedure Evaluation (Signed)
Anesthesia Post Note  Patient: Logan Griffith  Procedure(s) Performed: Procedure(s) (LRB): RIGHT DEEP BRAIN STIMULATOR PLACEMENT (Right)  Patient location during evaluation: PACU Anesthesia Type: MAC Level of consciousness: awake and alert Pain management: pain level controlled Vital Signs Assessment: post-procedure vital signs reviewed and stable Respiratory status: spontaneous breathing, nonlabored ventilation, respiratory function stable and patient connected to nasal cannula oxygen Cardiovascular status: stable and blood pressure returned to baseline Anesthetic complications: no       Last Vitals:  Vitals:   06/24/16 1202 06/24/16 1234  BP:  (!) 114/58  Pulse:  69  Resp:  18  Temp: 36.6 C 36.6 C    Last Pain:  Vitals:   06/24/16 1135  TempSrc:   PainSc: 0-No pain                 Tiajuana Amass

## 2016-06-25 LAB — GLUCOSE, CAPILLARY: GLUCOSE-CAPILLARY: 121 mg/dL — AB (ref 65–99)

## 2016-06-25 MED ORDER — HYDROCODONE-ACETAMINOPHEN 5-325 MG PO TABS: 1.0000 | ORAL_TABLET | ORAL | 0 refills | Status: DC | PRN

## 2016-06-25 NOTE — Progress Notes (Signed)
Patient alert and oriented, mae's well, voiding adequate amount of urine, swallowing without difficulty, no c/o pain. Patient discharged home with family. Script and discharged instructions given to patient. Patient and family stated understanding of d/c instructions given and has an appointment for Jan 26th for next surgery.

## 2016-06-25 NOTE — Evaluation (Signed)
Occupational Therapy Evaluation Patient Details Name: Logan Griffith MRN: [id number] DOB: October 01, 1940 Today's Date: 06/25/2016    History of Present Illness Pt is a 76 y/o male s/p R deep brain stimulator (DBS) placement. PMH including but not limited to L DBS placement (08/2015), HTN, DM and L ankle fusion.  Past Medical History:  Diagnosis Date  . Arthritis   . BPH (benign prostatic hyperplasia)    takes Finasteride and Flomax daily  . Depression    takes Zoloft daily  . Diabetes ([place])    takes Toujeo,Jardiance,Amaryl,and Tradjenta daily  . Eczema    uses a cream  . History of colon polyps    benign  . History of kidney stones   . Hyperlipemia    takes Atorvastatin daily as well as Lopid  . Hypertension    takes Amlodipine,Labetalol daily as well as Losartan-HCTZ  . Low back pain    sciatica  . Nocturia   . Peripheral neuropathy ([place])   . Peripheral neuropathy ([facility])    takes Gabapentin daily  . Psoriasis   . Sleep apnea   . Tremor   . Urinary urgency       Clinical Impression   Patient evaluated by Occupational Therapy with no further acute OT needs identified. All education has been completed and the patient has no further questions. See below for any follow-up Occupational Therapy or equipment needs. OT to sign off. Thank you for referral.      Follow Up Recommendations  No OT follow up    Equipment Recommendations  None recommended by OT    Recommendations for Other Services       Precautions / Restrictions Precautions Precautions: Fall      Mobility Bed Mobility Overal bed mobility: Modified Independent                Transfers Overall transfer level: Needs assistance Equipment used: Straight cane Transfers: Sit to/from Stand Sit to Stand: Supervision              Balance Overall balance assessment: Needs assistance Sitting-balance support: No upper extremity supported;Feet supported Sitting balance-Leahy Scale: Good      Standing balance support: Single extremity supported;During functional activity Standing balance-Leahy Scale: Poor                              ADL Overall ADL's : Modified independent                                       General ADL Comments: able to complete bil Le crossing bil LE, able to complete shower transfer      Vision     Perception     Praxis      Pertinent Vitals/Pain Pain Assessment: 0-10 Pain Score: 3  Pain Location: incision site Pain Descriptors / Indicators: Sore Pain Intervention(s): Monitored during session;Premedicated before session;Repositioned     Hand Dominance Right   Extremity/Trunk Assessment Upper Extremity Assessment Upper Extremity Assessment: Overall WFL for tasks assessed   Lower Extremity Assessment Lower Extremity Assessment: Overall WFL for tasks assessed   Cervical / Trunk Assessment Cervical / Trunk Exceptions: hx of lower back pain   Communication Communication Communication: No difficulties   Cognition Arousal/Alertness: Awake/alert Behavior During Therapy: WFL for tasks assessed/performed Overall Cognitive Status: Within Functional Limits for tasks assessed  General Comments       Exercises       Shoulder Instructions      Home Living Family/patient expects to be discharged to:: Private residence Living Arrangements: Spouse/significant other Available Help at Discharge: Family;Available 24 hours/day Type of Home: House Home Access: Stairs to enter [employer] of Steps: 3 Entrance Stairs-Rails: Right;Left Home Layout: Two level;Laundry or work area in basement;Able to live on main level with bedroom/bathroom     Bathroom Shower/Tub: Occupational psychologist: [place]: Radio producer - single point          Prior Functioning/Environment Level of Independence: Independent with assistive device(s)                  OT Problem List:     OT Treatment/Interventions:      OT Goals(Current goals can be found in the care plan section) Acute Rehab OT Goals Patient Stated Goal: return home tomorrow OT Goal Formulation: With patient  OT Frequency:     Barriers to D/C:            Co-evaluation              End of Session Equipment Utilized During Treatment: Gait belt Nurse Communication: Mobility status;Weight bearing status  Activity Tolerance: Patient tolerated treatment well Patient left: in bed;with call bell/phone within reach   Time: 0832-0843 OT Time Calculation (min): 11 min Charges:  OT General Charges $OT Visit: 1 Procedure OT Evaluation $OT Eval Low Complexity: 1 Procedure G-Codes:    Parke Poisson B 07-05-16, 9:41 AM   Jeri Modena   OTR/L PagerIP:[phone] Office: [phone] .

## 2016-06-25 NOTE — Progress Notes (Signed)
Subjective: Patient reports doing well  Objective: Vital signs in last 24 hours: Temp:  [97.4 F (36.3 C)-98.6 F (37 C)] 98.2 F (36.8 C) (01/20 0405) Pulse Rate:  [58-89] 76 (01/20 0405) Resp:  [15-24] 20 (01/20 0405) BP: (89-153)/(56-81) 153/76 (01/20 0405) SpO2:  [90 %-100 %] 95 % (01/20 0405) Weight:  [120.2 kg (265 lb)] 120.2 kg (265 lb) (01/19 0711)  Intake/Output from previous day: 01/19 0701 - 01/20 0700 In: 880 [P.O.:480; I.V.:400] Out: 75 [Blood:75] Intake/Output this shift: No intake/output data recorded.  Physical Exam: Dressings CDI.  MAEW, walking well without imbalance, speech fluent.  Lab Results:  Recent Labs  06/24/16 0719  WBC 7.9  HGB 12.9*  HCT 38.6*  PLT 145*   BMET  Recent Labs  06/24/16 0719  NA 140  K 4.0  CL 106  CO2 21*  GLUCOSE 138*  BUN 47*  CREATININE 1.75*  CALCIUM 9.8    Studies/Results: No results found.  Assessment/Plan: Doing well.  D/C home.  Stage II next Friday.    LOS: 1 day    Peggyann Shoals, MD 06/25/2016, 6:16 AM

## 2016-06-25 NOTE — Discharge Instructions (Signed)
Wound Care Leave incision open to air. You may shower. Do not scrub directly on incision.  Do not put any creams, lotions, or ointments on incision. Activity Walk each and every day, increasing distance each day. No lifting greater than 5 lbs.  No driving for 2 weeks; may ride as a passenger locally.  Diet Resume your normal diet.  Return to Work Will be discussed at you follow up appointment. Call Your Doctor If Any of These Occur Redness, drainage, or swelling at the wound.  Temperature greater than 101 degrees. Severe pain not relieved by pain medication. Incision starts to come apart. Follow Up Appt Call the office for any problem - [phone]

## 2016-06-25 NOTE — Discharge Summary (Signed)
Physician Discharge Summary  Patient ID: Logan Griffith MRN: [id number] DOB/AGE: 77-Oct-1942 77 y.o.  Admit date: 06/24/2016 Discharge date: 06/25/2016  Admission Diagnoses:Tremor  Discharge Diagnoses: Same Active Problems:   Tremor   Discharged Condition: good  Hospital Course: Patient underwent DBS VIM right for tremor and did well  Consults: None  Significant Diagnostic Studies: None  Treatments: surgery: DBS VIM right for tremor  Discharge Exam: Blood pressure (!) 153/76, pulse 76, temperature 98.2 F (36.8 C), temperature source Axillary, resp. rate 20, height 6\' 1"  (1.854 m), weight 120.2 kg (265 lb), SpO2 95 %. Neurologic: Alert and oriented X 3, normal strength and tone. Normal symmetric reflexes. Normal coordination and gait Wound:CDI  Disposition: Home  Discharge Instructions    Discharge instructions    Complete by:  As directed      Allergies as of 06/25/2016      Reactions   No Known Allergies       Medication List    STOP taking these medications   aspirin 81 MG tablet   aspirin-sod bicarb-citric acid 325 MG Tbef tablet Commonly known as:  ALKA-SELTZER   naproxen sodium 220 MG tablet Commonly known as:  ANAPROX     TAKE these medications   amLODipine 10 MG tablet Commonly known as:  NORVASC Take 10 mg by mouth daily.   atorvastatin 40 MG tablet Commonly known as:  LIPITOR Take 40 mg by mouth daily.   beta carotene w/minerals tablet Take 1 tablet by mouth daily.   clotrimazole 1 % cream Commonly known as:  LOTRIMIN Apply 1 application topically 2 (two) times daily.   finasteride 5 MG tablet Commonly known as:  PROSCAR Take 5 mg by mouth daily.   gabapentin 600 MG tablet Commonly known as:  NEURONTIN Take 600 mg by mouth 2 (two) times daily.   gemfibrozil 600 MG tablet Commonly known as:  LOPID Take 600 mg by mouth 2 (two) times daily before a meal.   glimepiride 4 MG tablet Commonly known as:  AMARYL Take 4 mg by mouth daily  with breakfast.   HYDROcodone-acetaminophen 5-325 MG tablet Commonly known as:  NORCO/VICODIN Take 1-2 tablets by mouth every 4 (four) hours as needed (mild pain). What changed:  Another medication with the same name was added. Make sure you understand how and when to take each.   HYDROcodone-acetaminophen 5-325 MG tablet Commonly known as:  NORCO/VICODIN Take 1-2 tablets by mouth every 4 (four) hours as needed (mild pain). What changed:  You were already taking a medication with the same name, and this prescription was added. Make sure you understand how and when to take each.   JARDIANCE 25 MG Tabs tablet Generic drug:  empagliflozin Take 25 mg by mouth daily.   labetalol 100 MG tablet Commonly known as:  NORMODYNE Take 100 mg by mouth 2 (two) times daily.   losartan-hydrochlorothiazide 100-25 MG tablet Commonly known as:  HYZAAR Take 1 tablet by mouth daily.   pimecrolimus 1 % cream Commonly known as:  ELIDEL Apply 1 application topically daily as needed (for psoriasis).   REFRESH OP Apply 1 drop to eye daily as needed (dry eyes).   sertraline 100 MG tablet Commonly known as:  ZOLOFT Take 100 mg by mouth daily.   tamsulosin 0.4 MG Caps capsule Commonly known as:  FLOMAX Take 0.4 mg by mouth daily.   TOUJEO SOLOSTAR 300 UNIT/ML Sopn Generic drug:  Insulin Glargine Inject 56 Units into the skin at bedtime.   TRADJENTA 5  MG Tabs tablet Generic drug:  linagliptin Take 5 mg by mouth daily.   vitamin B-12 1000 MCG tablet Commonly known as:  CYANOCOBALAMIN Take 1,000 mcg by mouth daily.        Signed: Peggyann Shoals, MD 06/25/2016, 6:17 AM

## 2016-06-28 ENCOUNTER — Telehealth: Payer: Self-pay | Admitting: Neurology

## 2016-06-28 ENCOUNTER — Encounter (HOSPITAL_COMMUNITY): Payer: Self-pay | Admitting: Neurosurgery

## 2016-06-28 DIAGNOSIS — Z9889 Other specified postprocedural states: Secondary | ICD-10-CM

## 2016-06-28 DIAGNOSIS — R251 Tremor, unspecified: Secondary | ICD-10-CM

## 2016-06-28 NOTE — Telephone Encounter (Signed)
-----   Message from [place], DO sent at 06/28/2016  7:56 AM EST ----- Will need post op MRI scheduled.  Coordinate with Dusty.  He will be here today.  Needs to be on 1.5 T with post op protocol.  Pt already has in IPG so [employer]tronic will need to be present to turn off

## 2016-06-30 ENCOUNTER — Encounter (HOSPITAL_COMMUNITY): Payer: Self-pay | Admitting: *Deleted

## 2016-06-30 ENCOUNTER — Other Ambulatory Visit: Payer: Self-pay | Admitting: Neurosurgery

## 2016-06-30 NOTE — Progress Notes (Signed)
Pt denies SOB, chest pain, and being under the care of a cardiologist. Pt denies having a stress test, echo and cardiac cath. Pt made aware to  stop taking Aspirin, vitamins, fish oil and herbal medications. Do not take any NSAIDs ie: Ibuprofen, Advil, BC and Goody Powder and  Naproxen or any medication containing Aspirin. Pt stated that his fasting blood glucose ranges between 130-140. Pt made aware of diabetes protocol to take only 44 units of Toujeo insulin tonight, do not take any diabetes pills in the morning, check blood glucose every 2 hours prior to arrival, interventions for a blood glucose < 70 ( drink 4 oz. Of Apple juice and recheck BG in 15 minutes after drinking juice. Pt verbalized understanding of all pre-op instructions.

## 2016-07-01 ENCOUNTER — Encounter (HOSPITAL_COMMUNITY): Payer: Self-pay | Admitting: Urology

## 2016-07-01 ENCOUNTER — Ambulatory Visit (HOSPITAL_COMMUNITY): Payer: Medicare Other | Admitting: Anesthesiology

## 2016-07-01 ENCOUNTER — Ambulatory Visit (HOSPITAL_COMMUNITY)
Admission: RE | Admit: 2016-07-01 | Discharge: 2016-07-01 | Disposition: A | Discharge status: Home / Self Care | Payer: Medicare Other | Source: Ambulatory Visit | Attending: Neurosurgery | Admitting: Neurosurgery

## 2016-07-01 ENCOUNTER — Encounter (HOSPITAL_COMMUNITY)
Admission: RE | Disposition: A | Discharge status: Home / Self Care | Payer: Self-pay | Source: Ambulatory Visit | Attending: Neurosurgery

## 2016-07-01 DIAGNOSIS — Z794 Long term (current) use of insulin: Secondary | ICD-10-CM | POA: Insufficient documentation

## 2016-07-01 DIAGNOSIS — Z87891 Personal history of nicotine dependence: Secondary | ICD-10-CM | POA: Diagnosis not present

## 2016-07-01 DIAGNOSIS — I10 Essential (primary) hypertension: Secondary | ICD-10-CM | POA: Insufficient documentation

## 2016-07-01 DIAGNOSIS — Z79899 Other long term (current) drug therapy: Secondary | ICD-10-CM | POA: Diagnosis not present

## 2016-07-01 DIAGNOSIS — G250 Essential tremor: Secondary | ICD-10-CM | POA: Diagnosis present

## 2016-07-01 DIAGNOSIS — E119 Type 2 diabetes mellitus without complications: Secondary | ICD-10-CM | POA: Insufficient documentation

## 2016-07-01 HISTORY — PX: PULSE GENERATOR IMPLANT: SHX5370

## 2016-07-01 LAB — GLUCOSE, CAPILLARY
GLUCOSE-CAPILLARY: 152 mg/dL — AB (ref 65–99)
Glucose-Capillary: 186 mg/dL — ABNORMAL HIGH (ref 65–99)
Glucose-Capillary: 158 mg/dL — ABNORMAL HIGH (ref 65–99)
Glucose-Capillary: 226 mg/dL — ABNORMAL HIGH (ref 65–99)

## 2016-07-01 SURGERY — UNILATERAL PULSE GENERATOR IMPLANT
Anesthesia: General | Site: Chest | Laterality: Right

## 2016-07-01 MED ORDER — FENTANYL CITRATE (PF) 100 MCG/2ML IJ SOLN
INTRAMUSCULAR | Status: AC
  Filled 2016-07-01: qty 4

## 2016-07-01 MED ORDER — SODIUM CHLORIDE 0.9 % IR SOLN
Status: DC | PRN
  Administered 2016-07-01: 08:00:00

## 2016-07-01 MED ORDER — FINASTERIDE 5 MG PO TABS: 5.0000 mg | ORAL_TABLET | Freq: Every day | ORAL | Status: DC

## 2016-07-01 MED ORDER — OXYCODONE-ACETAMINOPHEN 5-325 MG PO TABS: 1.0000 | ORAL_TABLET | ORAL | Status: DC | PRN

## 2016-07-01 MED ORDER — SERTRALINE HCL 50 MG PO TABS: 100.0000 mg | ORAL_TABLET | Freq: Every day | ORAL | Status: DC

## 2016-07-01 MED ORDER — PHENYLEPHRINE HCL 10 MG/ML IJ SOLN
INTRAMUSCULAR | Status: DC | PRN
  Administered 2016-07-01: 80 ug/min via INTRAVENOUS

## 2016-07-01 MED ORDER — PROPOFOL 10 MG/ML IV BOLUS
INTRAVENOUS | Status: AC
  Filled 2016-07-01: qty 20

## 2016-07-01 MED ORDER — INSULIN GLARGINE 100 UNIT/ML ~~LOC~~ SOLN
56.0000 [IU] | Freq: Every day | SUBCUTANEOUS | Status: DC
  Filled 2016-07-01: qty 0.56

## 2016-07-01 MED ORDER — FENTANYL CITRATE (PF) 100 MCG/2ML IJ SOLN
INTRAMUSCULAR | Status: AC
  Filled 2016-07-01: qty 2

## 2016-07-01 MED ORDER — FENTANYL CITRATE (PF) 100 MCG/2ML IJ SOLN
INTRAMUSCULAR | Status: DC | PRN
  Administered 2016-07-01 (×3): 100 ug via INTRAVENOUS

## 2016-07-01 MED ORDER — OXYCODONE HCL 5 MG/5ML PO SOLN: 5.0000 mg | Freq: Once | ORAL | Status: DC | PRN

## 2016-07-01 MED ORDER — SUGAMMADEX SODIUM 200 MG/2ML IV SOLN
INTRAVENOUS | Status: AC
  Filled 2016-07-01: qty 2

## 2016-07-01 MED ORDER — DEXTROSE 5 % IV SOLN
3.0000 g | Freq: Three times a day (TID) | INTRAVENOUS | Status: DC
  Administered 2016-07-01: 3 g via INTRAVENOUS
  Filled 2016-07-01 (×2): qty 3000

## 2016-07-01 MED ORDER — BACITRACIN ZINC 500 UNIT/GM EX OINT
TOPICAL_OINTMENT | CUTANEOUS | Status: DC | PRN
  Administered 2016-07-01: 1 via TOPICAL

## 2016-07-01 MED ORDER — DOCUSATE SODIUM 100 MG PO CAPS: 100.0000 mg | ORAL_CAPSULE | Freq: Two times a day (BID) | ORAL | Status: DC

## 2016-07-01 MED ORDER — LOSARTAN POTASSIUM 50 MG PO TABS: 100.0000 mg | ORAL_TABLET | Freq: Every day | ORAL | Status: DC

## 2016-07-01 MED ORDER — VANCOMYCIN HCL 1000 MG IV SOLR
INTRAVENOUS | Status: DC | PRN
  Administered 2016-07-01: 1000 mg via TOPICAL

## 2016-07-01 MED ORDER — EPHEDRINE SULFATE 50 MG/ML IJ SOLN
INTRAMUSCULAR | Status: DC | PRN
  Administered 2016-07-01 (×3): 15 mg via INTRAVENOUS

## 2016-07-01 MED ORDER — LACTATED RINGERS IV SOLN
INTRAVENOUS | Status: DC | PRN
  Administered 2016-07-01 (×2): via INTRAVENOUS

## 2016-07-01 MED ORDER — OCUVITE PO TABS: 1.0000 | ORAL_TABLET | Freq: Every day | ORAL | Status: DC

## 2016-07-01 MED ORDER — LIDOCAINE HCL (CARDIAC) 20 MG/ML IV SOLN
INTRAVENOUS | Status: DC | PRN
  Administered 2016-07-01: 80 mg via INTRAVENOUS

## 2016-07-01 MED ORDER — PHENOL 1.4 % MT LIQD: 1.0000 | OROMUCOSAL | Status: DC | PRN

## 2016-07-01 MED ORDER — HYDROCODONE-ACETAMINOPHEN 5-325 MG PO TABS: 1.0000 | ORAL_TABLET | ORAL | Status: DC | PRN

## 2016-07-01 MED ORDER — ATORVASTATIN CALCIUM 20 MG PO TABS: 40.0000 mg | ORAL_TABLET | Freq: Every day | ORAL | Status: DC

## 2016-07-01 MED ORDER — OCUVITE-LUTEIN PO CAPS
1.0000 | ORAL_CAPSULE | Freq: Every day | ORAL | Status: DC
  Filled 2016-07-01: qty 1

## 2016-07-01 MED ORDER — ONDANSETRON HCL 4 MG/2ML IJ SOLN: 4.0000 mg | INTRAMUSCULAR | Status: DC | PRN

## 2016-07-01 MED ORDER — ONDANSETRON HCL 4 MG/2ML IJ SOLN
INTRAMUSCULAR | Status: DC | PRN
  Administered 2016-07-01: 4 mg via INTRAVENOUS

## 2016-07-01 MED ORDER — MENTHOL 3 MG MT LOZG
1.0000 | LOZENGE | OROMUCOSAL | Status: DC | PRN
Start: 2016-07-01 — End: 2016-07-01

## 2016-07-01 MED ORDER — GLYCOPYRROLATE 0.2 MG/ML IJ SOLN
INTRAMUSCULAR | Status: DC | PRN
Start: 2016-07-01 — End: 2016-07-01
  Administered 2016-07-01 (×2): .4 mg via INTRAVENOUS

## 2016-07-01 MED ORDER — GEMFIBROZIL 600 MG PO TABS
600.0000 mg | ORAL_TABLET | Freq: Two times a day (BID) | ORAL | Status: DC
  Administered 2016-07-01: 600 mg via ORAL
  Filled 2016-07-01 (×2): qty 1

## 2016-07-01 MED ORDER — SODIUM CHLORIDE 0.9% FLUSH: 3.0000 mL | Freq: Two times a day (BID) | INTRAVENOUS | Status: DC

## 2016-07-01 MED ORDER — POLYETHYLENE GLYCOL 3350 17 G PO PACK: 17.0000 g | PACK | Freq: Every day | ORAL | Status: DC | PRN

## 2016-07-01 MED ORDER — CANAGLIFLOZIN 100 MG PO TABS
100.0000 mg | ORAL_TABLET | Freq: Every day | ORAL | Status: DC
  Filled 2016-07-01: qty 1

## 2016-07-01 MED ORDER — FENTANYL CITRATE (PF) 100 MCG/2ML IJ SOLN: 25.0000 ug | INTRAMUSCULAR | Status: DC | PRN

## 2016-07-01 MED ORDER — TAMSULOSIN HCL 0.4 MG PO CAPS
0.4000 mg | ORAL_CAPSULE | Freq: Every day | ORAL | Status: DC
  Administered 2016-07-01: 0.4 mg via ORAL
  Filled 2016-07-01: qty 1

## 2016-07-01 MED ORDER — OXYCODONE HCL 5 MG PO TABS: 5.0000 mg | ORAL_TABLET | Freq: Once | ORAL | Status: DC | PRN

## 2016-07-01 MED ORDER — LABETALOL HCL 100 MG PO TABS
100.0000 mg | ORAL_TABLET | Freq: Two times a day (BID) | ORAL | Status: DC
  Filled 2016-07-01: qty 1

## 2016-07-01 MED ORDER — SUGAMMADEX SODIUM 200 MG/2ML IV SOLN
INTRAVENOUS | Status: DC | PRN
  Administered 2016-07-01: 200 mg via INTRAVENOUS

## 2016-07-01 MED ORDER — INSULIN ASPART 100 UNIT/ML ~~LOC~~ SOLN
0.0000 [IU] | Freq: Three times a day (TID) | SUBCUTANEOUS | Status: DC
  Administered 2016-07-01: 5 [IU] via SUBCUTANEOUS

## 2016-07-01 MED ORDER — LOSARTAN POTASSIUM-HCTZ 100-25 MG PO TABS: 1.0000 | ORAL_TABLET | Freq: Every day | ORAL | Status: DC

## 2016-07-01 MED ORDER — BUPIVACAINE HCL (PF) 0.5 % IJ SOLN
INTRAMUSCULAR | Status: DC | PRN
  Administered 2016-07-01: 10 mL

## 2016-07-01 MED ORDER — GABAPENTIN 600 MG PO TABS: 600.0000 mg | ORAL_TABLET | Freq: Two times a day (BID) | ORAL | Status: DC

## 2016-07-01 MED ORDER — GLIMEPIRIDE 2 MG PO TABS: 4.0000 mg | ORAL_TABLET | Freq: Every day | ORAL | Status: DC

## 2016-07-01 MED ORDER — 0.9 % SODIUM CHLORIDE (POUR BTL) OPTIME
TOPICAL | Status: DC | PRN
  Administered 2016-07-01: 1000 mL

## 2016-07-01 MED ORDER — LIDOCAINE-EPINEPHRINE (PF) 2 %-1:200000 IJ SOLN
INTRAMUSCULAR | Status: AC
  Filled 2016-07-01: qty 20

## 2016-07-01 MED ORDER — ACETAMINOPHEN 650 MG RE SUPP: 650.0000 mg | RECTAL | Status: DC | PRN

## 2016-07-01 MED ORDER — CEFAZOLIN SODIUM-DEXTROSE 2-4 GM/100ML-% IV SOLN
INTRAVENOUS | Status: AC
  Filled 2016-07-01: qty 100

## 2016-07-01 MED ORDER — TAMSULOSIN HCL 0.4 MG PO CAPS: 0.4000 mg | ORAL_CAPSULE | Freq: Every day | ORAL | Status: DC

## 2016-07-01 MED ORDER — KCL IN DEXTROSE-NACL 20-5-0.45 MEQ/L-%-% IV SOLN: INTRAVENOUS | Status: DC

## 2016-07-01 MED ORDER — INSULIN GLARGINE 300 UNIT/ML ~~LOC~~ SOPN: 56.0000 [IU] | PEN_INJECTOR | Freq: Every day | SUBCUTANEOUS | Status: DC

## 2016-07-01 MED ORDER — PROPOFOL 10 MG/ML IV BOLUS
INTRAVENOUS | Status: DC | PRN
  Administered 2016-07-01: 50 mg via INTRAVENOUS
  Administered 2016-07-01: 150 mg via INTRAVENOUS

## 2016-07-01 MED ORDER — POLYVINYL ALCOHOL 1.4 % OP SOLN
2.0000 [drp] | Freq: Three times a day (TID) | OPHTHALMIC | Status: DC
  Administered 2016-07-01: 2 [drp] via OPHTHALMIC
  Filled 2016-07-01: qty 15

## 2016-07-01 MED ORDER — LIDOCAINE-EPINEPHRINE (PF) 2 %-1:200000 IJ SOLN
INTRAMUSCULAR | Status: DC | PRN
  Administered 2016-07-01: 10 mL via INTRADERMAL

## 2016-07-01 MED ORDER — SODIUM CHLORIDE 0.9 % IV SOLN: 250.0000 mL | INTRAVENOUS | Status: DC

## 2016-07-01 MED ORDER — LINAGLIPTIN 5 MG PO TABS: 5.0000 mg | ORAL_TABLET | Freq: Every day | ORAL | Status: DC

## 2016-07-01 MED ORDER — DEXAMETHASONE SODIUM PHOSPHATE 10 MG/ML IJ SOLN
INTRAMUSCULAR | Status: DC | PRN
  Administered 2016-07-01: 5 mg via INTRAVENOUS

## 2016-07-01 MED ORDER — VANCOMYCIN HCL 1000 MG IV SOLR
INTRAVENOUS | Status: AC
  Filled 2016-07-01: qty 1000

## 2016-07-01 MED ORDER — CEFAZOLIN SODIUM 1 G IJ SOLR
INTRAMUSCULAR | Status: DC | PRN
  Administered 2016-07-01: 3 g via INTRAMUSCULAR

## 2016-07-01 MED ORDER — PIMECROLIMUS 1 % EX CREA: 1.0000 "application " | TOPICAL_CREAM | Freq: Every day | CUTANEOUS | Status: DC | PRN

## 2016-07-01 MED ORDER — CARBOXYMETHYLCELLULOSE SODIUM 1 % OP SOLN: 2.0000 [drp] | Freq: Three times a day (TID) | OPHTHALMIC | Status: DC

## 2016-07-01 MED ORDER — ROCURONIUM BROMIDE 100 MG/10ML IV SOLN
INTRAVENOUS | Status: DC | PRN
  Administered 2016-07-01: 50 mg via INTRAVENOUS

## 2016-07-01 MED ORDER — SODIUM CHLORIDE 0.9% FLUSH: 3.0000 mL | INTRAVENOUS | Status: DC | PRN

## 2016-07-01 MED ORDER — ONDANSETRON HCL 4 MG/2ML IJ SOLN: 4.0000 mg | Freq: Once | INTRAMUSCULAR | Status: DC | PRN

## 2016-07-01 MED ORDER — AMLODIPINE BESYLATE 10 MG PO TABS: 10.0000 mg | ORAL_TABLET | Freq: Every day | ORAL | Status: DC

## 2016-07-01 MED ORDER — PHENYLEPHRINE HCL 10 MG/ML IJ SOLN
INTRAMUSCULAR | Status: DC | PRN
  Administered 2016-07-01: 80 ug via INTRAVENOUS
  Administered 2016-07-01 (×2): 120 ug via INTRAVENOUS

## 2016-07-01 MED ORDER — CLOTRIMAZOLE 1 % EX CREA
1.0000 "application " | TOPICAL_CREAM | Freq: Two times a day (BID) | CUTANEOUS | Status: DC
  Filled 2016-07-01: qty 15

## 2016-07-01 MED ORDER — HYDROCHLOROTHIAZIDE 25 MG PO TABS: 25.0000 mg | ORAL_TABLET | Freq: Every day | ORAL | Status: DC

## 2016-07-01 MED ORDER — VITAMIN B-12 1000 MCG PO TABS
1000.0000 ug | ORAL_TABLET | Freq: Every day | ORAL | Status: DC
  Administered 2016-07-01: 1000 ug via ORAL
  Filled 2016-07-01: qty 1

## 2016-07-01 MED ORDER — ACETAMINOPHEN 325 MG PO TABS: 650.0000 mg | ORAL_TABLET | ORAL | Status: DC | PRN

## 2016-07-01 SURGICAL SUPPLY — 58 items
BANDAGE ADH SHEER 1  50/CT (GAUZE/BANDAGES/DRESSINGS) IMPLANT
BLADE CLIPPER SURG (BLADE) ×3 IMPLANT
BLADE SURG 10 STRL SS (BLADE) ×3 IMPLANT
BLADE SURG 11 STRL SS (BLADE) ×6 IMPLANT
BLADE SURG 15 STRL LF DISP TIS (BLADE) ×1 IMPLANT
BLADE SURG 15 STRL SS (BLADE) ×2
BOOT SUTURE AID YELLOW STND (SUTURE) IMPLANT
CANISTER SUCT 3000ML PPV (MISCELLANEOUS) ×3 IMPLANT
CARTRIDGE OIL MAESTRO DRILL (MISCELLANEOUS) IMPLANT
CLIP RANEY DISP (INSTRUMENTS) IMPLANT
COIL EXT DBS STRETCH 40 (Lead) ×3 IMPLANT
CORDS BIPOLAR (ELECTRODE) ×3 IMPLANT
COVER BACK TABLE 60X90IN (DRAPES) ×3 IMPLANT
COVER MAYO STAND STRL (DRAPES) ×3 IMPLANT
DECANTER SPIKE VIAL GLASS SM (MISCELLANEOUS) ×3 IMPLANT
DERMABOND ADVANCED (GAUZE/BANDAGES/DRESSINGS) ×6
DERMABOND ADVANCED .7 DNX12 (GAUZE/BANDAGES/DRESSINGS) ×3 IMPLANT
DIFFUSER DRILL AIR PNEUMATIC (MISCELLANEOUS) IMPLANT
DRAPE C-ARM 42X72 X-RAY (DRAPES) IMPLANT
DRAPE CAMERA CLOSED 9X96 (DRAPES) ×3 IMPLANT
DRAPE INCISE IOBAN 66X45 STRL (DRAPES) ×3 IMPLANT
DRSG OPSITE POSTOP 3X4 (GAUZE/BANDAGES/DRESSINGS) ×6 IMPLANT
DRSG OPSITE POSTOP 4X6 (GAUZE/BANDAGES/DRESSINGS) ×3 IMPLANT
DRSG TELFA 3X8 NADH (GAUZE/BANDAGES/DRESSINGS) ×3 IMPLANT
DURAPREP 26ML APPLICATOR (WOUND CARE) ×6 IMPLANT
ELECT CAUTERY BLADE 6.4 (BLADE) IMPLANT
GAUZE SPONGE 4X4 12PLY STRL (GAUZE/BANDAGES/DRESSINGS) IMPLANT
GAUZE SPONGE 4X4 16PLY XRAY LF (GAUZE/BANDAGES/DRESSINGS) ×6 IMPLANT
GLOVE BIO SURGEON STRL SZ8 (GLOVE) ×3 IMPLANT
GLOVE ECLIPSE 8.0 STRL XLNG CF (GLOVE) ×3 IMPLANT
GLOVE INDICATOR 8.0 STRL GRN (GLOVE) ×3 IMPLANT
GLOVE INDICATOR 8.5 STRL (GLOVE) ×3 IMPLANT
KIT BASIN OR (CUSTOM PROCEDURE TRAY) ×3 IMPLANT
KIT REMOVER STAPLE SKIN (MISCELLANEOUS) ×3 IMPLANT
MARKER SKIN DUAL TIP RULER LAB (MISCELLANEOUS) ×6 IMPLANT
NEEDLE HYPO 25X1 1.5 SAFETY (NEEDLE) ×3 IMPLANT
NEEDLE SPNL 18GX3.5 QUINCKE PK (NEEDLE) IMPLANT
NEEDLE SPNL 22GX3.5 QUINCKE BK (NEEDLE) IMPLANT
NEUROSTIM OCTOPOLAR ~~LOC~~ 60X55 (Neuro Prosthesis/Implant) ×3 IMPLANT
OIL CARTRIDGE MAESTRO DRILL (MISCELLANEOUS)
PACK EENT II TURBAN DRAPE (CUSTOM PROCEDURE TRAY) ×3 IMPLANT
PAD ARMBOARD 7.5X6 YLW CONV (MISCELLANEOUS) ×6 IMPLANT
PATTIES SURGICAL 1X1 (DISPOSABLE) IMPLANT
PENCIL BUTTON HOLSTER BLD 10FT (ELECTRODE) ×3 IMPLANT
SPONGE LAP 4X18 X RAY DECT (DISPOSABLE) ×3 IMPLANT
STAPLER SKIN PROX WIDE 3.9 (STAPLE) ×3 IMPLANT
STAPLER VISISTAT (STAPLE) ×3 IMPLANT
SUT BONE WAX W31G (SUTURE) ×3 IMPLANT
SUT ETHILON 3 0 PS 1 (SUTURE) IMPLANT
SUT SILK 2 0 FS (SUTURE) ×3 IMPLANT
SUT SILK 2 0 TIES 10X30 (SUTURE) IMPLANT
SUT VIC AB 2-0 CP2 18 (SUTURE) ×6 IMPLANT
SYR BULB 3OZ (MISCELLANEOUS) ×3 IMPLANT
SYR BULB IRRIGATION 50ML (SYRINGE) ×3 IMPLANT
SYR CONTROL 10ML LL (SYRINGE) ×3 IMPLANT
TOOL TUNNELING (INSTRUMENTS) ×3 IMPLANT
TUBE CONNECTING 12'X1/4 (SUCTIONS) ×1
TUBE CONNECTING 12X1/4 (SUCTIONS) ×2 IMPLANT

## 2016-07-01 NOTE — H&P (View-Only) (Signed)
Subjective: Patient reports doing well  Objective: Vital signs in last 24 hours: Temp:  [97.4 F (36.3 C)-98.6 F (37 C)] 98.2 F (36.8 C) (01/20 0405) Pulse Rate:  [58-89] 76 (01/20 0405) Resp:  [15-24] 20 (01/20 0405) BP: (89-153)/(56-81) 153/76 (01/20 0405) SpO2:  [90 %-100 %] 95 % (01/20 0405) Weight:  [120.2 kg (265 lb)] 120.2 kg (265 lb) (01/19 0711)  Intake/Output from previous day: 01/19 0701 - 01/20 0700 In: 880 [P.O.:480; I.V.:400] Out: 75 [Blood:75] Intake/Output this shift: No intake/output data recorded.  Physical Exam: Dressings CDI.  MAEW, walking well without imbalance, speech fluent.  Lab Results:  Recent Labs  06/24/16 0719  WBC 7.9  HGB 12.9*  HCT 38.6*  PLT 145*   BMET  Recent Labs  06/24/16 0719  NA 140  K 4.0  CL 106  CO2 21*  GLUCOSE 138*  BUN 47*  CREATININE 1.75*  CALCIUM 9.8    Studies/Results: No results found.  Assessment/Plan: Doing well.  D/C home.  Stage II next Friday.    LOS: 1 day    Peggyann Shoals, MD 06/25/2016, 6:16 AM

## 2016-07-01 NOTE — Brief Op Note (Signed)
07/01/2016  9:07 AM  PATIENT:  Logan Griffith  76 y.o. male  PRE-OPERATIVE DIAGNOSIS:  ESSENTIAL TREMOR, s/p right VIM DBS electrode placement  POST-OPERATIVE DIAGNOSIS:   ESSENTIAL TREMOR, s/p right VIM DBS electrode placement   PROCEDURE:  Procedure(s) with comments: RIGHT IMPLANTABLE PULSE GENERATOR PLACEMENT (Right) - RIGHT IMPLANTABLE PULSE GENERATOR PLACEMENT with extensions  SURGEON:  Surgeon(s) and Role:    * Erline Levine, MD - Primary  PHYSICIAN ASSISTANT:   ASSISTANTS: Poteat, RN, Jake Bathe, PAS   ANESTHESIA:   general  EBL:  Total I/O In: 1300 [I.V.:1300] Out: 30 [Blood:30]  BLOOD ADMINISTERED:none  DRAINS: none   LOCAL MEDICATIONS USED:  MARCAINE    and LIDOCAINE   SPECIMEN:  No Specimen  DISPOSITION OF SPECIMEN:  N/A  COUNTS:  YES  TOURNIQUET:  * No tourniquets in log *  DICTATION: DICTATION: Patient has implanted bilateral VIM stimulator electrodes having recently completed DBS Stage I and now presents for placement of lead extensions and IPG implantation on the right (left sided system placed at previous surgery).  PROCEDURE: Patient was brought to the operating room and GETA anesthesia was induced.  Right upper chest, scalp, neck were prepped with betadine scrub and Duraprep.  Area of planned incision was infiltrated with lidocaine.  Scalp incision was made and the lead extensions were exposed. An incision was made in the right upper chest and a pocket was created.  Extension tunnel was made from scalp to pocket.   Due to steep angle, barrel chest, and redundant skin, an intervening incision was made to assist safe tunneling of the extension to the chest pocket.  IPG was placed and attached to lead extension, which in turn were connected to cranial leads and torqued appropriately.  Covering boot was placed,    The IPG  was placed in the pocket and a stay suture was placed to prevent IPG migration.  Wounds were irrigated with vancomycin.  Incisions were closed with  2-0 Vicryl and 3-0 vicryl sutures at the pocket and 2-0 vicryl at the scalp with staples. and dressed with sterile occlusive dressings.  Counts were correct at the end of the case.  PLAN OF CARE: Admit for overnight observation  PATIENT DISPOSITION:  PACU - hemodynamically stable.   Delay start of Pharmacological VTE agent (>24hrs) due to surgical blood loss or risk of bleeding: yes

## 2016-07-01 NOTE — Anesthesia Postprocedure Evaluation (Addendum)
Anesthesia Post Note  Patient: Logan Griffith  Procedure(s) Performed: Procedure(s) (LRB): RIGHT IMPLANTABLE PULSE GENERATOR PLACEMENT (Right)  Patient location during evaluation: PACU Anesthesia Type: General Level of consciousness: awake, awake and alert and oriented Pain management: pain level controlled Vital Signs Assessment: post-procedure vital signs reviewed and stable Respiratory status: spontaneous breathing, nonlabored ventilation and respiratory function stable Cardiovascular status: blood pressure returned to baseline Anesthetic complications: no       Last Vitals:  Vitals:   07/01/16 1121 07/01/16 1306  BP: 123/72 (!) 102/48  Pulse: 78 (!) 58  Resp: 20 20  Temp: 36.6 C 36.5 C    Last Pain:  Vitals:   07/01/16 1000  TempSrc:   PainSc: 0-No pain                 Nyree Yonker COKER

## 2016-07-01 NOTE — Progress Notes (Signed)
Pt is unable to void after many attempts. One I&O cath was performed @ 1530 with 850cc of yellow urine return. After 5 hours Pt attempted to void again without success. Bladder scan results were greater than 650cc. Foley was placed per MD order. Pt will be D/C'd home with leg bag and will follow up urologist next week. Pt and wife were educated on Foley care at home and verbal understanding was provided. Holli Humbles, RN

## 2016-07-01 NOTE — Progress Notes (Signed)
Pt and wife given D/C instructions with verbal understanding. Pt's IV was removed prior to D/C. Pt's incisions are slightly stained with minimally drainage. Pt D/C'd home with leg bag due to acute urinary retention. Pt D/C'd home via wheelchair @ 2015 per MD order. Pt is stable @ D/C and has no other needs at this time. Holli Humbles, RN

## 2016-07-01 NOTE — Op Note (Signed)
07/01/2016  9:07 AM  PATIENT:  Logan Griffith  76 y.o. male  PRE-OPERATIVE DIAGNOSIS:  ESSENTIAL TREMOR, s/p right VIM DBS electrode placement  POST-OPERATIVE DIAGNOSIS:   ESSENTIAL TREMOR, s/p right VIM DBS electrode placement   PROCEDURE:  Procedure(s) with comments: RIGHT IMPLANTABLE PULSE GENERATOR PLACEMENT (Right) - RIGHT IMPLANTABLE PULSE GENERATOR PLACEMENT with extensions  SURGEON:  Surgeon(s) and Role:    * Erline Levine, MD - Primary  PHYSICIAN ASSISTANT:   ASSISTANTS: Poteat, RN, Jake Bathe, PAS   ANESTHESIA:   general  EBL:  Total I/O In: 1300 [I.V.:1300] Out: 30 [Blood:30]  BLOOD ADMINISTERED:none  DRAINS: none   LOCAL MEDICATIONS USED:  MARCAINE    and LIDOCAINE   SPECIMEN:  No Specimen  DISPOSITION OF SPECIMEN:  N/A  COUNTS:  YES  TOURNIQUET:  * No tourniquets in log *  DICTATION: DICTATION: Patient has implanted bilateral VIM stimulator electrodes having recently completed DBS Stage I and now presents for placement of lead extensions and IPG implantation on the right (left sided system placed at previous surgery).  PROCEDURE: Patient was brought to the operating room and GETA anesthesia was induced.  Right upper chest, scalp, neck were prepped with betadine scrub and Duraprep.  Area of planned incision was infiltrated with lidocaine.  Scalp incision was made and the lead extensions were exposed. An incision was made in the right upper chest and a pocket was created.  Extension tunnel was made from scalp to pocket.   Due to steep angle, barrel chest, and redundant skin, an intervening incision was made to assist safe tunneling of the extension to the chest pocket.  IPG was placed and attached to lead extension, which in turn were connected to cranial leads and torqued appropriately.  Covering boot was placed,    The IPG  was placed in the pocket and a stay suture was placed to prevent IPG migration.  Wounds were irrigated with vancomycin.  Incisions were closed with  2-0 Vicryl and 3-0 vicryl sutures at the pocket and 2-0 vicryl at the scalp with staples. and dressed with sterile occlusive dressings.  Counts were correct at the end of the case.  PLAN OF CARE: Admit for overnight observation  PATIENT DISPOSITION:  PACU - hemodynamically stable.   Delay start of Pharmacological VTE agent (>24hrs) due to surgical blood loss or risk of bleeding: yes

## 2016-07-01 NOTE — Transfer of Care (Signed)
Immediate Anesthesia Transfer of Care Note  Patient: Logan Griffith  Procedure(s) Performed: Procedure(s) with comments: RIGHT IMPLANTABLE PULSE GENERATOR PLACEMENT (Right) - RIGHT IMPLANTABLE PULSE GENERATOR PLACEMENT  Patient Location: PACU  Anesthesia Type:General  Level of Consciousness: awake, alert , oriented and patient cooperative  Airway & Oxygen Therapy: Patient Spontanous Breathing and Patient connected to nasal cannula oxygen  Post-op Assessment: Report given to RN and Post -op Vital signs reviewed and stable  Post vital signs: Reviewed and stable  Last Vitals:  Vitals:   07/01/16 0623 07/01/16 0922  BP: 123/63 (P) 115/72  Pulse: 69   Resp: 18   Temp: 36.7 C (P) 36.4 C    Last Pain:  Vitals:   07/01/16 0649  TempSrc:   PainSc: 7       Patients Stated Pain Goal: 3 ([id number])  Complications: No apparent anesthesia complications

## 2016-07-01 NOTE — Telephone Encounter (Signed)
Spoke with Dusty and he will schedule MR and let me know date/time.

## 2016-07-01 NOTE — Anesthesia Preprocedure Evaluation (Addendum)
Anesthesia Evaluation  Patient identified by MRN, date of birth, ID band Patient awake    Reviewed: Allergy & Precautions, NPO status , Patient's Chart, lab work & pertinent test results  Airway Mallampati: II  TM Distance: >3 FB Neck ROM: Full    Dental  (+) Partial Lower, Dental Advisory Given   Pulmonary former smoker,    breath sounds clear to auscultation       Cardiovascular hypertension,  Rhythm:Regular Rate:Normal     Neuro/Psych    GI/Hepatic   Endo/Other  diabetes  Renal/GU      Musculoskeletal   Abdominal   Peds  Hematology   Anesthesia Other Findings   Reproductive/Obstetrics                            Anesthesia Physical Anesthesia Plan  ASA: III  Anesthesia Plan: General   Post-op Pain Management:    Induction: Intravenous  Airway Management Planned:   Additional Equipment:   Intra-op Plan:   Post-operative Plan: Extubation in OR  Informed Consent: I have reviewed the patients History and Physical, chart, labs and discussed the procedure including the risks, benefits and alternatives for the proposed anesthesia with the patient or authorized representative who has indicated his/her understanding and acceptance.   Dental advisory given  Plan Discussed with: CRNA and Anesthesiologist  Anesthesia Plan Comments:        Anesthesia Quick Evaluation

## 2016-07-01 NOTE — Anesthesia Procedure Notes (Signed)
Procedure Name: Intubation Date/Time: 07/01/2016 7:44 AM Performed by: Salli Quarry Jonothan Heberle Pre-anesthesia Checklist: Patient identified, Emergency Drugs available, Suction available and Patient being monitored Patient Re-evaluated:Patient Re-evaluated prior to inductionOxygen Delivery Method: Circle System Utilized Preoxygenation: Pre-oxygenation with 100% oxygen Intubation Type: IV induction Ventilation: Mask ventilation without difficulty and Oral airway inserted - appropriate to patient size Laryngoscope Size: Mac and 4 Grade View: Grade I Tube type: Oral Tube size: 7.5 mm Number of attempts: 1 Airway Equipment and Method: Stylet and Oral airway Placement Confirmation: ETT inserted through vocal cords under direct vision,  positive ETCO2 and breath sounds checked- equal and bilateral Secured at: 21 cm Tube secured with: Tape Dental Injury: Teeth and Oropharynx as per pre-operative assessment

## 2016-07-01 NOTE — Interval H&P Note (Signed)
History and Physical Interval Note:  07/01/2016 7:29 AM  Logan Griffith  has presented today for surgery, with the diagnosis of ESSENTIAL TREMOR  The various methods of treatment have been discussed with the patient and family. After consideration of risks, benefits and other options for treatment, the patient has consented to  Procedure(s) with comments: [place] (Right) - [place] as a surgical intervention .  The patient's history has been reviewed, patient examined, no change in status, stable for surgery.  I have reviewed the patient's chart and labs.  Questions were answered to the patient's satisfaction.     Tristian Sickinger D

## 2016-07-07 ENCOUNTER — Telehealth: Payer: Self-pay | Admitting: Neurology

## 2016-07-07 NOTE — Telephone Encounter (Signed)
PT called and wanted a call back form Dr Doristine Devoid nurse/Dawn CB# [phone]

## 2016-07-07 NOTE — Telephone Encounter (Signed)
Patient confirmed date and time of post op DBS MRI.

## 2016-07-08 ENCOUNTER — Encounter (HOSPITAL_COMMUNITY): Payer: Self-pay | Admitting: Neurosurgery

## 2016-07-15 ENCOUNTER — Ambulatory Visit (HOSPITAL_COMMUNITY)
Admission: RE | Admit: 2016-07-15 | Discharge: 2016-07-15 | Disposition: A | Discharge status: Home / Self Care | Payer: Medicare Other | Source: Ambulatory Visit | Attending: Neurology | Admitting: Neurology

## 2016-07-15 DIAGNOSIS — Z9889 Other specified postprocedural states: Secondary | ICD-10-CM | POA: Insufficient documentation

## 2016-07-15 DIAGNOSIS — R251 Tremor, unspecified: Secondary | ICD-10-CM | POA: Diagnosis present

## 2016-07-19 NOTE — Progress Notes (Signed)
Subjective:   Logan Griffith was seen in consultation in the movement disorder clinic at the request of [facility].  The evaluation is for tremor.  The patient is seen in consultation at the request of Dr. Jannifer Franklin.  I have reviewed her notes and appreciate those.  This patient is accompanied in the office by his spouse and child who supplement the history.  The patient has had essential tremor for approximately 5 years according to records.   It has been worse over the last 2 years.  It is in both hands.  He notices it most in the R hand but he is R hand dominant.  He has trouble writing checks and eating sounds.  The patient is on primidone, 250 mg daily.  He has never been on a higher dosage than this but he indicates that it is not helpful.  The patient states he is on labetalol, 300 mg twice a day.  He was told that he could take a higher dosage of this to see if that helped his tremor, but he has not done that.  Pt states that the labetolol is for BP, however.   He has noticed no benefit with the primidone.  Unfortunately, these medications are not helping.  He does have a history of kidney stones, and therefore is not able to have Topamax.  There is no family hx of tremor.  He comes in today looking for an opinion regarding DBS therapy.   Affected by caffeine:  No. (1-2 cups coffee/day - 18 oz each; 1-2 diet soda's a day) Affected by alcohol:  No. Affected by stress:  No. Affected by fatigue:  No. Spills soup if on spoon:  Yes.   Spills glass of liquid if full:  Yes.   Affects ADL's (tying shoes, brushing teeth, etc):  Yes.   (has to shave with 2 hands with blade)  Current/Previously tried tremor medications: primidone; beta blocker for BP  Outside reports reviewed: historical medical records and office notes.  04/07/15 update:  The patient returns today, accompanied by his wife and son who supplement the history.  The patient had neuropsych testing done by Dr. Richrd Sox on  03/18/2015.  She felt that he would be a good DBS candidate.  No evidence of dementia.  The patient remains on primidone, 250 mg twice a day for his tremor.  He is also on labetalol 300 mg twice a day but that is primarily for his blood pressure.  He has a history of nephrolithiasis and cannot take Topamax.  He remains interested in DBS therapy.  06/18/15 update: The patient follows up with me today, and final preparation for his DBS surgery.  It is scheduled for 07/30/2014.  He remains on primidone, 250 mg but is down to once a day.  He has seen Dr. Vertell Limber and reviewed logistics of surgery with him.  He expresses desire to proceed with DBS therapy.  09/04/15 update:  The patient is following up today, accompanied by his wife, son, daughter in law who supplement the history.  The patient is status post DBS surgery to the left VIM on 07/31/2015.  He had his generator placed on 08/07/2015.  He did have difficulty voiding after the surgery and did have to leave the hospital with a Foley in place.  He has had some nausea following surgery.    09/11/15 update:  The patient is following up today, accompanied by his wife who supplements the history.  The patient reports that  he has been doing well this week.  He is happy to report that he has written checks from the checkbook for the first time in a long time.  He also states that he has been shave with one hand.  He asks me about potentially having DBS for the left hand.  He continues to have some nausea.  I have reviewed records from his primary care provider from [place].  They started him on ranitidine.  He is not sure if it is helping.  He has episodes of emesis up to 3 times per week.  This seemed to start after surgery.  11/19/15 update:  The patient is following up today for his ET.  The patient is status post DBS surgery to the left VIM on 07/31/2015.  He had his generator placed on 08/07/2015.  He had places froze on his arms and legs.  Nausea is much better  than previous but "it has been a long while."  He thinks that is related to eating too much.  Overall, very happy with DBS.  No significant tremor on the right hand.  He saw Dr. Vertell Limber yesterday and pt mentioned that he is thinking about doing DBS on the other side.    03/23/16 update:  Pt f/u for ET.  He is doing well with regards to tremor in the right hand.  He wants to do DBS on the other side.  Asks me if we can get it done after the first of the year.  He eats in the living room and holds the glass in one hand and has trouble holding food with the other hand.   In regards to nausea he states that he has been doing well.  He had a few falls trying to tie his shoes and fell out of the chair (just slipped off the cushion).  He is in PT now.  07/21/16 update:  Patient follows up today, accompanied by his wife and son who supplement the history.  The patient underwent right VIM DBS on 06/24/2016.  He had his IPG placed on 07/01/2016.  Postoperative MRI showed expected lead placement.  Patient reports that he has recovered well.  He did fall 3 times in one day.  He fell over a chair.  He then was at a restaurant and spilled his drink and slipped on the drink.  He also fell outside that day.  Been to the doctor several times as he hurt his shoulder.    Allergies  Allergen Reactions  . No Known Allergies     Outpatient Encounter Prescriptions as of 07/21/2016  Medication Sig  . amLODipine (NORVASC) 10 MG tablet Take 10 mg by mouth daily.  Marland Kitchen atorvastatin (LIPITOR) 40 MG tablet Take 40 mg by mouth daily.  . beta carotene w/minerals (OCUVITE) tablet Take 1 tablet by mouth daily.  . clotrimazole (LOTRIMIN) 1 % cream Apply 1 application topically 2 (two) times daily.  . empagliflozin (JARDIANCE) 25 MG TABS tablet Take 25 mg by mouth daily.  . finasteride (PROSCAR) 5 MG tablet Take 5 mg by mouth daily.   Marland Kitchen gabapentin (NEURONTIN) 600 MG tablet Take 600 mg by mouth 2 (two) times daily.  Marland Kitchen gemfibrozil (LOPID)  600 MG tablet Take 600 mg by mouth 2 (two) times daily before a meal.  . glimepiride (AMARYL) 4 MG tablet Take 4 mg by mouth daily with breakfast.  . HYDROcodone-acetaminophen (NORCO/VICODIN) 5-325 MG tablet Take 1-2 tablets by mouth every 4 (four) hours as needed (  mild pain).  Marland Kitchen labetalol (NORMODYNE) 100 MG tablet Take 100 mg by mouth 2 (two) times daily.  Marland Kitchen linagliptin (TRADJENTA) 5 MG TABS tablet Take 5 mg by mouth daily.  Marland Kitchen losartan-hydrochlorothiazide (HYZAAR) 100-25 MG per tablet Take 1 tablet by mouth daily.  . pimecrolimus (ELIDEL) 1 % cream Apply 1 application topically daily as needed (for psoriasis).  . Polyvinyl Alcohol-Povidone (REFRESH OP) Apply 1 drop to eye daily as needed (dry eyes).  . sertraline (ZOLOFT) 100 MG tablet Take 100 mg by mouth daily.  . tamsulosin (FLOMAX) 0.4 MG CAPS capsule Take 0.4 mg by mouth daily.   Nelva Nay SOLOSTAR 300 UNIT/ML SOPN Inject 56 Units into the skin at bedtime.   . vitamin B-12 (CYANOCOBALAMIN) 1000 MCG tablet Take 1,000 mcg by mouth daily.  . [DISCONTINUED] HYDROcodone-acetaminophen (NORCO/VICODIN) 5-325 MG tablet Take 1-2 tablets by mouth every 4 (four) hours as needed (mild pain).   No facility-administered encounter medications on file as of 07/21/2016.     Past Medical History:  Diagnosis Date  . Arthritis   . BPH (benign prostatic hyperplasia)    takes Finasteride and Flomax daily  . Depression    takes Zoloft daily  . Diabetes ([place])    takes Toujeo,Jardiance,Amaryl,and Tradjenta daily  . Eczema    uses a cream  . History of colon polyps    benign  . History of kidney stones   . Hyperlipemia    takes Atorvastatin daily as well as Lopid  . Hypertension    takes Amlodipine,Labetalol daily as well as Losartan-HCTZ  . Low back pain    sciatica  . Nocturia   . Peripheral neuropathy ([place])   . Peripheral neuropathy ([facility])    takes Gabapentin daily  . Psoriasis   . Sleep apnea   . Tremor   . Urinary urgency     Past  Surgical History:  Procedure Laterality Date  . ANKLE ARTHROSCOPY Left    x 2  . ANKLE FUSION Left   . CARPAL TUNNEL RELEASE N/A 07/24/2015   Procedure: Fiducial placement for deep brain stimulator;  Surgeon: Erline Levine, MD;  Location: [place] [facility];  Service: Neurosurgery;  Laterality: N/A;  Fiducial placement for deep brain stimulator  . CIRCUMCISION    . COLONOSCOPY    . EYE SURGERY Bilateral    blepharoplasty  . PULSE GENERATOR IMPLANT Left 08/07/2015   Procedure: UNILATERAL PULSE GENERATOR IMPLANT (LEFT);  Surgeon: Erline Levine, MD;  Location: [place] [facility];  Service: Neurosurgery;  Laterality: Left;  UNILATERAL PULSE GENERATOR IMPLANT (LEFT)  . PULSE GENERATOR IMPLANT Right 07/01/2016   Procedure: RIGHT IMPLANTABLE PULSE GENERATOR PLACEMENT;  Surgeon: Erline Levine, MD;  Location: [place];  Service: Neurosurgery;  Laterality: Right;  RIGHT IMPLANTABLE PULSE GENERATOR PLACEMENT  . SUBTHALAMIC STIMULATOR INSERTION Left 07/31/2015   Procedure: LEFT DEEP BRAIN STIMULATOR INSERTION;  Surgeon: Erline Levine, MD;  Location: [place] [facility];  Service: Neurosurgery;  Laterality: Left;  LEFT DEEP BRAIN STIMULATOR INSERTION  . SUBTHALAMIC STIMULATOR INSERTION Right 06/24/2016   Procedure: RIGHT DEEP BRAIN STIMULATOR PLACEMENT;  Surgeon: Erline Levine, MD;  Location: [place];  Service: Neurosurgery;  Laterality: Right;  RIGHT DEEP BRAIN STIMULATOR PLACEMENT  . Thumb surgery Left    ligament    Social History   Social History  . Marital status: Married    Spouse name: N/A  . Number of children: N/A  . Years of education: N/A   Occupational History  . Not on file.   Social  History Main Topics  . Smoking status: Former Smoker    Years: 24.00  . Smokeless tobacco: Never Used     Comment: quit smoking in 1984  . Alcohol use No  . Drug use: No  . Sexual activity: Not on file   Other Topics Concern  . Not on file   Social History Narrative  . No narrative on file    Family Status  Relation  Status  . Mother Deceased   HTN  . Father Deceased   HTN  . Brother Alive    Review of Systems  A complete 10 system ROS was obtained and was negative apart from what is mentioned.   Objective:   VITALS:   Vitals:   07/21/16 1050  BP: 102/62  Pulse: 65  Weight: 252 lb (114.3 kg)  Height: 6\' 1"  (1.854 m)   Gen:  Appears stated age and in NAD. HEENT:  Normocephalic, atraumatic. The mucous membranes are moist. The superficial temporal arteries are without ropiness or tenderness. Cardiovascular: Regular rate and rhythm. Lungs: Clear to auscultation bilaterally. Neck: There are no carotid bruits noted bilaterally.  NEUROLOGICAL:  Orientation:  The patient is alert and oriented x 3.   Cranial nerves: There is good facial symmetry.  Speech is fluent and clear. Soft palate rises symmetrically and there is no tongue deviation. Hearing is intact to conversational tone. Tone: Tone is good throughout. Sensation: Sensation is intact to light touch throughout Coordination:  The patient has no dysdiadichokinesia or dysmetria. Motor: Strength is 5/5 in the bilateral upper and lower extremities.  Shoulder shrug is equal bilaterally.  There is no pronator drift.  There are no fasciculations noted. Gait and Station: The patient ambulates in a very antalgic fashion stating that he has a bad ankle  MOVEMENT EXAM: Tremor:  No tremor in the R hand.  Has tremor of the L hand, worse when given something of weight and tries to do something like buttoning.  DBS programming performed today and described on separate procedural note.     Assessment/Plan:   1.  Essential Tremor.  -The patient is status post DBS surgery to the left VIM on 07/31/2015.  He had his generator placed on 08/07/2015.  -The patient underwent right VIM DBS on 06/24/2016.  He had his IPG placed on 07/01/2016. 2.  Diabetic peripheral neuropathy  -I do think that this the main reason for loss of balance.  We discussed control  of diabetes.    -had several falls and will send for PT 3.  Nausea, following surgery  -He has had nausea following first surgery in 2017.  This seemed to improve somewhat after reprogramming his device, but also seemed to improve following treatments for reflux and watching the diet.  He still has issues if he overeats.  It is markedly better than it was. 4.  Much greater than 50% of this visit was spent in counseling and coordinating care.  Total face to face time:  20 min which didn't include the DBS time which was most of the time

## 2016-07-21 ENCOUNTER — Encounter: Payer: Self-pay | Admitting: Neurology

## 2016-07-21 ENCOUNTER — Telehealth: Payer: Self-pay | Admitting: Neurology

## 2016-07-21 ENCOUNTER — Ambulatory Visit (INDEPENDENT_AMBULATORY_CARE_PROVIDER_SITE_OTHER): Payer: Medicare Other | Admitting: Neurology

## 2016-07-21 VITALS — BP 102/62 | HR 65 | Ht 73.0 in | Wt 252.0 lb

## 2016-07-21 DIAGNOSIS — G250 Essential tremor: Secondary | ICD-10-CM

## 2016-07-21 DIAGNOSIS — E1142 Type 2 diabetes mellitus with diabetic polyneuropathy: Secondary | ICD-10-CM | POA: Diagnosis not present

## 2016-07-21 DIAGNOSIS — R296 Repeated falls: Secondary | ICD-10-CM | POA: Diagnosis not present

## 2016-07-21 NOTE — Procedures (Signed)
DBS Programming was performed.    Total time spent programming was 45 minutes.  Device was confirmed to be on.  Soft start was on at 8.  Impedences were checked and were within normal limits.  Battery was checked and was determined to be functioning normally and not near the end of life.  Final settings were as follows:  Left brain electrode:     1-2+           ; Amplitude  2.8   V   ; Pulse width 90 microseconds;   Frequency   160   Hz.  Right brain electrode:   2-C+         ; Amplitude  2.8   V   ; Pulse width 60 microseconds;   Frequency   130   Hz.   (attempted 2-1+ and 1-2+ without significant control of tremor.  Still without great control but left here given nausea with initial activation in the past and will work in future)

## 2016-07-21 NOTE — Addendum Note (Signed)
Addended byAnnamaria Helling on: 07/21/2016 02:31 PM   Modules accepted: Orders

## 2016-07-21 NOTE — Telephone Encounter (Signed)
Referral faxed to [facility] outpatient therapy for physical therapy at [phone] with confirmation received. They will contact patient to schedule.

## 2016-08-02 ENCOUNTER — Telehealth: Payer: Self-pay | Admitting: Neurology

## 2016-08-02 NOTE — Telephone Encounter (Signed)
-----   Message from [place], DO sent at 08/02/2016  1:14 PM EST ----- See if patient willing to come in at 2.  Tell him that DBS is going to work on his programming from 2-3 and then I will see him at 29

## 2016-08-02 NOTE — Telephone Encounter (Signed)
Patient will be here at 2 pm on Friday.

## 2016-08-04 NOTE — Progress Notes (Signed)
Subjective:   Logan Griffith was seen in consultation in the movement disorder clinic at the request of [facility].  The evaluation is for tremor.  The patient is seen in consultation at the request of Dr. Jannifer Franklin.  I have reviewed her notes and appreciate those.  This patient is accompanied in the office by his spouse and child who supplement the history.  The patient has had essential tremor for approximately 5 years according to records.   It has been worse over the last 2 years.  It is in both hands.  He notices it most in the R hand but he is R hand dominant.  He has trouble writing checks and eating sounds.  The patient is on primidone, 250 mg daily.  He has never been on a higher dosage than this but he indicates that it is not helpful.  The patient states he is on labetalol, 300 mg twice a day.  He was told that he could take a higher dosage of this to see if that helped his tremor, but he has not done that.  Pt states that the labetolol is for BP, however.   He has noticed no benefit with the primidone.  Unfortunately, these medications are not helping.  He does have a history of kidney stones, and therefore is not able to have Topamax.  There is no family hx of tremor.  He comes in today looking for an opinion regarding DBS therapy.   Affected by caffeine:  No. (1-2 cups coffee/day - 18 oz each; 1-2 diet soda's a day) Affected by alcohol:  No. Affected by stress:  No. Affected by fatigue:  No. Spills soup if on spoon:  Yes.   Spills glass of liquid if full:  Yes.   Affects ADL's (tying shoes, brushing teeth, etc):  Yes.   (has to shave with 2 hands with blade)  Current/Previously tried tremor medications: primidone; beta blocker for BP  Outside reports reviewed: historical medical records and office notes.  04/07/15 update:  The patient returns today, accompanied by his wife and son who supplement the history.  The patient had neuropsych testing done by Dr. Richrd Sox on  03/18/2015.  She felt that he would be a good DBS candidate.  No evidence of dementia.  The patient remains on primidone, 250 mg twice a day for his tremor.  He is also on labetalol 300 mg twice a day but that is primarily for his blood pressure.  He has a history of nephrolithiasis and cannot take Topamax.  He remains interested in DBS therapy.  06/18/15 update: The patient follows up with me today, and final preparation for his DBS surgery.  It is scheduled for 07/30/2014.  He remains on primidone, 250 mg but is down to once a day.  He has seen Dr. Vertell Limber and reviewed logistics of surgery with him.  He expresses desire to proceed with DBS therapy.  09/04/15 update:  The patient is following up today, accompanied by his wife, son, daughter in law who supplement the history.  The patient is status post DBS surgery to the left VIM on 07/31/2015.  He had his generator placed on 08/07/2015.  He did have difficulty voiding after the surgery and did have to leave the hospital with a Foley in place.  He has had some nausea following surgery.    09/11/15 update:  The patient is following up today, accompanied by his wife who supplements the history.  The patient reports that  he has been doing well this week.  He is happy to report that he has written checks from the checkbook for the first time in a long time.  He also states that he has been shave with one hand.  He asks me about potentially having DBS for the left hand.  He continues to have some nausea.  I have reviewed records from his primary care provider from [place].  They started him on ranitidine.  He is not sure if it is helping.  He has episodes of emesis up to 3 times per week.  This seemed to start after surgery.  11/19/15 update:  The patient is following up today for his ET.  The patient is status post DBS surgery to the left VIM on 07/31/2015.  He had his generator placed on 08/07/2015.  He had places froze on his arms and legs.  Nausea is much better  than previous but "it has been a long while."  He thinks that is related to eating too much.  Overall, very happy with DBS.  No significant tremor on the right hand.  He saw Dr. Vertell Limber yesterday and pt mentioned that he is thinking about doing DBS on the other side.    03/23/16 update:  Pt f/u for ET.  He is doing well with regards to tremor in the right hand.  He wants to do DBS on the other side.  Asks me if we can get it done after the first of the year.  He eats in the living room and holds the glass in one hand and has trouble holding food with the other hand.   In regards to nausea he states that he has been doing well.  He had a few falls trying to tie his shoes and fell out of the chair (just slipped off the cushion).  He is in PT now.  07/21/16 update:  Patient follows up today, accompanied by his wife and son who supplement the history.  The patient underwent right VIM DBS on 06/24/2016.  He had his IPG placed on 07/01/2016.  Postoperative MRI showed expected lead placement.  Patient reports that he has recovered well.  He did fall 3 times in one day.  He fell over a chair.  He then was at a restaurant and spilled his drink and slipped on the drink.  He also fell outside that day.  Been to the doctor several times as he hurt his shoulder.    08/05/16 update:  Patient follows up today, accompanied by his son who supplements the history.  Patient's device was activated somewhat 2 weeks ago.  He continues to have some tremor in the left hand.  He has had no falls since our last visit.  He has not had any nausea.  He does tell me after our last visit he had a passing out episode.  Was getting out of the car and passed out for few seconds.  No warning.  No loss bladder/bowel control.  No confusion after.  No palpitations.  Didn't check BP or BS after the event.  He had not eaten at all and it was past lunch time (he is diabetic).    Allergies  Allergen Reactions  . No Known Allergies     Outpatient  Encounter Prescriptions as of 08/05/2016  Medication Sig  . amLODipine (NORVASC) 10 MG tablet Take 10 mg by mouth daily.  Marland Kitchen atorvastatin (LIPITOR) 40 MG tablet Take 40 mg by mouth daily.  Marland Kitchen  beta carotene w/minerals (OCUVITE) tablet Take 1 tablet by mouth daily.  . clotrimazole (LOTRIMIN) 1 % cream Apply 1 application topically 2 (two) times daily.  . empagliflozin (JARDIANCE) 25 MG TABS tablet Take 25 mg by mouth daily.  . finasteride (PROSCAR) 5 MG tablet Take 5 mg by mouth daily.   Marland Kitchen gabapentin (NEURONTIN) 600 MG tablet Take 600 mg by mouth 2 (two) times daily.  Marland Kitchen gemfibrozil (LOPID) 600 MG tablet Take 600 mg by mouth 2 (two) times daily before a meal.  . glimepiride (AMARYL) 4 MG tablet Take 4 mg by mouth daily with breakfast.  . HYDROcodone-acetaminophen (NORCO/VICODIN) 5-325 MG tablet Take 1-2 tablets by mouth every 4 (four) hours as needed (mild pain).  Marland Kitchen labetalol (NORMODYNE) 100 MG tablet Take 100 mg by mouth 2 (two) times daily.  Marland Kitchen linagliptin (TRADJENTA) 5 MG TABS tablet Take 5 mg by mouth daily.  Marland Kitchen losartan-hydrochlorothiazide (HYZAAR) 100-25 MG per tablet Take 1 tablet by mouth daily.  . pimecrolimus (ELIDEL) 1 % cream Apply 1 application topically daily as needed (for psoriasis).  . Polyvinyl Alcohol-Povidone (REFRESH OP) Apply 1 drop to eye daily as needed (dry eyes).  . sertraline (ZOLOFT) 100 MG tablet Take 100 mg by mouth daily.  . tamsulosin (FLOMAX) 0.4 MG CAPS capsule Take 0.4 mg by mouth daily.   Nelva Nay SOLOSTAR 300 UNIT/ML SOPN Inject 56 Units into the skin at bedtime.   . vitamin B-12 (CYANOCOBALAMIN) 1000 MCG tablet Take 1,000 mcg by mouth daily.   No facility-administered encounter medications on file as of 08/05/2016.     Past Medical History:  Diagnosis Date  . Arthritis   . BPH (benign prostatic hyperplasia)    takes Finasteride and Flomax daily  . Depression    takes Zoloft daily  . Diabetes ([place])    takes Toujeo,Jardiance,Amaryl,and Tradjenta daily  .  Eczema    uses a cream  . History of colon polyps    benign  . History of kidney stones   . Hyperlipemia    takes Atorvastatin daily as well as Lopid  . Hypertension    takes Amlodipine,Labetalol daily as well as Losartan-HCTZ  . Low back pain    sciatica  . Nocturia   . Peripheral neuropathy ([place])   . Peripheral neuropathy ([facility])    takes Gabapentin daily  . Psoriasis   . Sleep apnea   . Tremor   . Urinary urgency     Past Surgical History:  Procedure Laterality Date  . ANKLE ARTHROSCOPY Left    x 2  . ANKLE FUSION Left   . CARPAL TUNNEL RELEASE N/A 07/24/2015   Procedure: Fiducial placement for deep brain stimulator;  Surgeon: Erline Levine, MD;  Location: [place] [facility];  Service: Neurosurgery;  Laterality: N/A;  Fiducial placement for deep brain stimulator  . CIRCUMCISION    . COLONOSCOPY    . EYE SURGERY Bilateral    blepharoplasty  . PULSE GENERATOR IMPLANT Left 08/07/2015   Procedure: UNILATERAL PULSE GENERATOR IMPLANT (LEFT);  Surgeon: Erline Levine, MD;  Location: [place] [facility];  Service: Neurosurgery;  Laterality: Left;  UNILATERAL PULSE GENERATOR IMPLANT (LEFT)  . PULSE GENERATOR IMPLANT Right 07/01/2016   Procedure: RIGHT IMPLANTABLE PULSE GENERATOR PLACEMENT;  Surgeon: Erline Levine, MD;  Location: [place];  Service: Neurosurgery;  Laterality: Right;  RIGHT IMPLANTABLE PULSE GENERATOR PLACEMENT  . SUBTHALAMIC STIMULATOR INSERTION Left 07/31/2015   Procedure: LEFT DEEP BRAIN STIMULATOR INSERTION;  Surgeon: Erline Levine, MD;  Location: [place] [facility];  Service: Neurosurgery;  Laterality: Left;  LEFT DEEP BRAIN STIMULATOR INSERTION  . SUBTHALAMIC STIMULATOR INSERTION Right 06/24/2016   Procedure: RIGHT DEEP BRAIN STIMULATOR PLACEMENT;  Surgeon: Erline Levine, MD;  Location: [place];  Service: Neurosurgery;  Laterality: Right;  RIGHT DEEP BRAIN STIMULATOR PLACEMENT  . Thumb surgery Left    ligament    Social History   Social History  . Marital status: Married    Spouse name: N/A    . Number of children: N/A  . Years of education: N/A   Occupational History  . Not on file.   Social History Main Topics  . Smoking status: Former Smoker    Years: 24.00  . Smokeless tobacco: Never Used     Comment: quit smoking in 1984  . Alcohol use No  . Drug use: No  . Sexual activity: Not on file   Other Topics Concern  . Not on file   Social History Narrative  . No narrative on file    Family Status  Relation Status  . Mother Deceased   HTN  . Father Deceased   HTN  . Brother Alive    Review of Systems  A complete 10 system ROS was obtained and was negative apart from what is mentioned.   Objective:   VITALS:   Vitals:   08/05/16 1342  BP: 130/70  Pulse: 80  Weight: 250 lb (113.4 kg)  Height: 6\' 1"  (1.854 m)   Gen:  Appears stated age and in NAD. HEENT:  Normocephalic, atraumatic. The mucous membranes are moist. The superficial temporal arteries are without ropiness or tenderness. Cardiovascular: Regular rate and rhythm. Lungs: Clear to auscultation bilaterally. Neck: There are no carotid bruits noted bilaterally.  NEUROLOGICAL:  Orientation:  The patient is alert and oriented x 3.   Cranial nerves: There is good facial symmetry.  Speech is fluent and clear. Soft palate rises symmetrically and there is no tongue deviation. Hearing is intact to conversational tone. Tone: Tone is good throughout. Sensation: Sensation is intact to light touch throughout Coordination:  The patient has no dysdiadichokinesia or dysmetria. Motor: Strength is 5/5 in the bilateral upper and lower extremities.  Shoulder shrug is equal bilaterally.  There is no pronator drift.  There are no fasciculations noted. Gait and Station: The patient ambulates in a very antalgic fashion stating that he has a bad ankle  MOVEMENT EXAM: Tremor:  No tremor in the R hand.  Has tremor of the L hand, worse when given something of weight and tries to do something like buttoning.  DBS  programming performed today and described on separate procedural note.     Assessment/Plan:   1.  Essential Tremor.  -The patient is status post DBS surgery to the left VIM on 07/31/2015.  He had his generator placed on 08/07/2015.  -The patient underwent right VIM DBS on 06/24/2016.  He had his IPG placed on 07/01/2016. 2.  Diabetic peripheral neuropathy  -I do think that this the main reason for loss of balance.  We discussed control of diabetes.    -had several falls and will send for PT 3.  Nausea, following surgery  -He has had nausea following first surgery in 2017.  This seemed to improve somewhat after reprogramming his device, but also seemed to improve following treatments for reflux and watching the diet.  He still has issues if he overeats.  It is markedly better than it was. 4.  Syncope  -wonder if related to Blood sugar  -  gave [place] driving laws  -hold driving x 6 months from 07/2016.   -told to f/u with PCP 5.  Much greater than 50% of this visit was spent in counseling and coordinating care.  Total face to face time:  20 min for visit but DBS programming time was 2 hours!  Pt arrived at 1:40pm and left office at 4pm.

## 2016-08-05 ENCOUNTER — Ambulatory Visit (INDEPENDENT_AMBULATORY_CARE_PROVIDER_SITE_OTHER): Payer: Medicare Other | Admitting: Neurology

## 2016-08-05 ENCOUNTER — Encounter: Payer: Self-pay | Admitting: Neurology

## 2016-08-05 VITALS — BP 130/70 | HR 80 | Ht 73.0 in | Wt 250.0 lb

## 2016-08-05 DIAGNOSIS — R55 Syncope and collapse: Secondary | ICD-10-CM

## 2016-08-05 DIAGNOSIS — G250 Essential tremor: Secondary | ICD-10-CM | POA: Diagnosis not present

## 2016-08-05 NOTE — Procedures (Signed)
DBS Programming was performed.    Total time spent programming was 120 minutes.  Device was confirmed to be on.  Soft start was on at 8.  Impedences were checked and were within normal limits.  Battery was checked and was determined to be functioning normally and not near the end of life.  Final settings were as follows:  Left brain electrode:     1-2+           ; Amplitude  2.8   V   ; Pulse width 90 microseconds;   Frequency   160   Hz.  Right brain electrode Group A:   1-2+     ; Amplitude: 4.0V; pulse width 100 microsec; frequency 150 HZ  Right brain electrode Group B:   2-C+         ; Amplitude  4.0   V   ; Pulse width 100 microseconds;   Frequency   150   Hz.     Pt left on group A.  Will call him in a week to see how he is doing.

## 2016-08-12 ENCOUNTER — Telehealth: Payer: Self-pay | Admitting: Neurology

## 2016-08-12 NOTE — Telephone Encounter (Signed)
Left message on machine for patient to call back.  To see how he is doing on setting A on DBS - if okay he should leave it on A if not okay-  switch to B and I will check on him in a week.  Awaiting call back.

## 2016-08-15 NOTE — Telephone Encounter (Signed)
Spoke with patient and he states he is not doing as well as he would like. He will switch to setting B and I will call in a week to see if he is better.

## 2016-08-23 ENCOUNTER — Telehealth: Payer: Self-pay | Admitting: Neurology

## 2016-08-23 NOTE — Telephone Encounter (Signed)
Spoke with patient and he states he thinks setting A was better on a week of setting B but neither are great.  Please advise.

## 2016-08-23 NOTE — Telephone Encounter (Signed)
Left message on machine for patient to call back.  To see how he is doing on setting B. Awaiting call back.

## 2016-08-23 NOTE — Telephone Encounter (Signed)
See if Logan Griffith has time to work with him some day.  Doesn't need an appt with me to do that.

## 2016-08-25 NOTE — Telephone Encounter (Signed)
Patient will come to the office to see Dusty March 30 at 10:30.

## 2016-08-31 ENCOUNTER — Telehealth: Payer: Self-pay | Admitting: Neurology

## 2016-08-31 NOTE — Telephone Encounter (Signed)
Patient called wanting to confirm Monday's appointment with [facility] and the time. He said it had been changed from 3/30. Thank you

## 2016-08-31 NOTE — Telephone Encounter (Signed)
Confirmed appt Monday at 1:00 pm with Dusty.

## 2016-09-05 ENCOUNTER — Ambulatory Visit (INDEPENDENT_AMBULATORY_CARE_PROVIDER_SITE_OTHER): Payer: Medicare Other | Admitting: Neurology

## 2016-09-05 DIAGNOSIS — G250 Essential tremor: Secondary | ICD-10-CM | POA: Diagnosis not present

## 2016-09-05 NOTE — Procedures (Signed)
Clinical history:  Patient having more falls since last programming.  Wife is very frustrated as the patient will not use his cane.  DBS Programming was performed.    Total time spent programming was 20 minutes by me and 45 minutes with the [employer] rep.  Device was confirmed to be on.  Soft start was on at 8.  Impedences were checked and were within normal limits.  Battery was checked and was determined to be functioning normally and not near the end of life.  Final settings were as follows:  Left brain electrode:     1-2+           ; Amplitude  2.8   V   ; Pulse width 90 microseconds;   Frequency   160   Hz.  Right brain electrode Group A:   0-C+     ; Amplitude: 4.0V; pulse width 150 microsec; frequency 180 HZ  Right brain electrode Group B:   2-C+         ; Amplitude  4.0   V   ; Pulse width 100 microseconds;   Frequency   150   Hz.     Pt left on group A.  Reiterated the importance of using his cane at all times.  I told the patient that if he falls, I want him to turn off the right brain electrode.  I told him that I never want to have good tremor control with the sacrifice of balance.  I reminded him that we had discussed the risk of loss of balance in bilateral VIM cases.  He has an appointment next week.  He will keep that.

## 2016-09-07 ENCOUNTER — Telehealth: Payer: Self-pay | Admitting: Neurology

## 2016-09-07 DIAGNOSIS — R2681 Unsteadiness on feet: Secondary | ICD-10-CM

## 2016-09-07 DIAGNOSIS — R4182 Altered mental status, unspecified: Secondary | ICD-10-CM

## 2016-09-07 DIAGNOSIS — Z9689 Presence of other specified functional implants: Secondary | ICD-10-CM

## 2016-09-07 NOTE — Telephone Encounter (Signed)
CT scheduled at [facility] for 09/08/16 to arrive at 11:30 am.   Called patient and lmom for them to call back and make them aware.

## 2016-09-07 NOTE — Addendum Note (Signed)
Addended byMargarette Asal L on: 09/07/2016 11:13 AM   Modules accepted: Orders

## 2016-09-07 NOTE — Progress Notes (Signed)
Subjective:   Logan Griffith was seen in consultation in the movement disorder clinic at the request of [facility].  The evaluation is for tremor.  The patient is seen in consultation at the request of Dr. Jannifer Franklin.  I have reviewed her notes and appreciate those.  This patient is accompanied in the office by his spouse and child who supplement the history.  The patient has had essential tremor for approximately 5 years according to records.   It has been worse over the last 2 years.  It is in both hands.  He notices it most in the R hand but he is R hand dominant.  He has trouble writing checks and eating sounds.  The patient is on primidone, 250 mg daily.  He has never been on a higher dosage than this but he indicates that it is not helpful.  The patient states he is on labetalol, 300 mg twice a day.  He was told that he could take a higher dosage of this to see if that helped his tremor, but he has not done that.  Pt states that the labetolol is for BP, however.   He has noticed no benefit with the primidone.  Unfortunately, these medications are not helping.  He does have a history of kidney stones, and therefore is not able to have Topamax.  There is no family hx of tremor.  He comes in today looking for an opinion regarding DBS therapy.   Affected by caffeine:  No. (1-2 cups coffee/day - 18 oz each; 1-2 diet soda's a day) Affected by alcohol:  No. Affected by stress:  No. Affected by fatigue:  No. Spills soup if on spoon:  Yes.   Spills glass of liquid if full:  Yes.   Affects ADL's (tying shoes, brushing teeth, etc):  Yes.   (has to shave with 2 hands with blade)  Current/Previously tried tremor medications: primidone; beta blocker for BP  Outside reports reviewed: historical medical records and office notes.  04/07/15 update:  The patient returns today, accompanied by his wife and son who supplement the history.  The patient had neuropsych testing done by Dr. Richrd Sox on  03/18/2015.  She felt that he would be a good DBS candidate.  No evidence of dementia.  The patient remains on primidone, 250 mg twice a day for his tremor.  He is also on labetalol 300 mg twice a day but that is primarily for his blood pressure.  He has a history of nephrolithiasis and cannot take Topamax.  He remains interested in DBS therapy.  06/18/15 update: The patient follows up with me today, and final preparation for his DBS surgery.  It is scheduled for 07/30/2014.  He remains on primidone, 250 mg but is down to once a day.  He has seen Dr. Vertell Limber and reviewed logistics of surgery with him.  He expresses desire to proceed with DBS therapy.  09/04/15 update:  The patient is following up today, accompanied by his wife, son, daughter in law who supplement the history.  The patient is status post DBS surgery to the left VIM on 07/31/2015.  He had his generator placed on 08/07/2015.  He did have difficulty voiding after the surgery and did have to leave the hospital with a Foley in place.  He has had some nausea following surgery.    09/11/15 update:  The patient is following up today, accompanied by his wife who supplements the history.  The patient reports that  he has been doing well this week.  He is happy to report that he has written checks from the checkbook for the first time in a long time.  He also states that he has been shave with one hand.  He asks me about potentially having DBS for the left hand.  He continues to have some nausea.  I have reviewed records from his primary care provider from [place].  They started him on ranitidine.  He is not sure if it is helping.  He has episodes of emesis up to 3 times per week.  This seemed to start after surgery.  11/19/15 update:  The patient is following up today for his ET.  The patient is status post DBS surgery to the left VIM on 07/31/2015.  He had his generator placed on 08/07/2015.  He had places froze on his arms and legs.  Nausea is much better  than previous but "it has been a long while."  He thinks that is related to eating too much.  Overall, very happy with DBS.  No significant tremor on the right hand.  He saw Dr. Vertell Limber yesterday and pt mentioned that he is thinking about doing DBS on the other side.    03/23/16 update:  Pt f/u for ET.  He is doing well with regards to tremor in the right hand.  He wants to do DBS on the other side.  Asks me if we can get it done after the first of the year.  He eats in the living room and holds the glass in one hand and has trouble holding food with the other hand.   In regards to nausea he states that he has been doing well.  He had a few falls trying to tie his shoes and fell out of the chair (just slipped off the cushion).  He is in PT now.  07/21/16 update:  Patient follows up today, accompanied by his wife and son who supplement the history.  The patient underwent right VIM DBS on 06/24/2016.  He had his IPG placed on 07/01/2016.  Postoperative MRI showed expected lead placement.  Patient reports that he has recovered well.  He did fall 3 times in one day.  He fell over a chair.  He then was at a restaurant and spilled his drink and slipped on the drink.  He also fell outside that day.  Been to the doctor several times as he hurt his shoulder.    08/05/16 update:  Patient follows up today, accompanied by his son who supplements the history.  Patient's device was activated somewhat 2 weeks ago.  He continues to have some tremor in the left hand.  He has had no falls since our last visit.  He has not had any nausea.  He does tell me after our last visit he had a passing out episode.  Was getting out of the car and passed out for few seconds.  No warning.  No loss bladder/bowel control.  No confusion after.  No palpitations.  Didn't check BP or BS after the event.  He had not eaten at all and it was past lunch time (he is diabetic).    09/12/16 update: Patient follows up today, accompanied by his wife who  supplements the history.  Patient's wife had told us last week (09/05/2016) that he was having a significant number of falls since changes were made to his DBS.  He worked hard on trying to reprogram the device  at that he had efficacy without falls.  His wife feels that he has had personality change and speech change.  Because of that, a CT of the brain was done and it was unremarkable.  He has not had any falls since the reprogramming a week ago but has had near falls.    Allergies  Allergen Reactions  . No Known Allergies     Outpatient Encounter Prescriptions as of 09/12/2016  Medication Sig  . amLODipine (NORVASC) 10 MG tablet Take 10 mg by mouth daily.  Marland Kitchen atorvastatin (LIPITOR) 40 MG tablet Take 40 mg by mouth daily.  . beta carotene w/minerals (OCUVITE) tablet Take 1 tablet by mouth daily.  . clotrimazole (LOTRIMIN) 1 % cream Apply 1 application topically 2 (two) times daily.  . empagliflozin (JARDIANCE) 25 MG TABS tablet Take 25 mg by mouth daily.  . finasteride (PROSCAR) 5 MG tablet Take 5 mg by mouth daily.   Marland Kitchen gabapentin (NEURONTIN) 600 MG tablet Take 600 mg by mouth 2 (two) times daily.  Marland Kitchen gemfibrozil (LOPID) 600 MG tablet Take 600 mg by mouth 2 (two) times daily before a meal.  . glimepiride (AMARYL) 4 MG tablet Take 4 mg by mouth daily with breakfast.  . HYDROcodone-acetaminophen (NORCO/VICODIN) 5-325 MG tablet Take 1-2 tablets by mouth every 4 (four) hours as needed (mild pain).  Marland Kitchen labetalol (NORMODYNE) 100 MG tablet Take 100 mg by mouth 2 (two) times daily.  Marland Kitchen linagliptin (TRADJENTA) 5 MG TABS tablet Take 5 mg by mouth daily.  Marland Kitchen losartan-hydrochlorothiazide (HYZAAR) 100-25 MG per tablet Take 1 tablet by mouth daily.  . pimecrolimus (ELIDEL) 1 % cream Apply 1 application topically daily as needed (for psoriasis).  . Polyvinyl Alcohol-Povidone (REFRESH OP) Apply 1 drop to eye daily as needed (dry eyes).  . sertraline (ZOLOFT) 100 MG tablet Take 100 mg by mouth daily.  . tamsulosin  (FLOMAX) 0.4 MG CAPS capsule Take 0.4 mg by mouth daily.   Nelva Nay SOLOSTAR 300 UNIT/ML SOPN Inject 56 Units into the skin at bedtime.   . vitamin B-12 (CYANOCOBALAMIN) 1000 MCG tablet Take 1,000 mcg by mouth daily.   No facility-administered encounter medications on file as of 09/12/2016.     Past Medical History:  Diagnosis Date  . Arthritis   . BPH (benign prostatic hyperplasia)    takes Finasteride and Flomax daily  . Depression    takes Zoloft daily  . Diabetes ([place])    takes Toujeo,Jardiance,Amaryl,and Tradjenta daily  . Eczema    uses a cream  . History of colon polyps    benign  . History of kidney stones   . Hyperlipemia    takes Atorvastatin daily as well as Lopid  . Hypertension    takes Amlodipine,Labetalol daily as well as Losartan-HCTZ  . Low back pain    sciatica  . Nocturia   . Peripheral neuropathy ([place])   . Peripheral neuropathy ([facility])    takes Gabapentin daily  . Psoriasis   . Sleep apnea   . Tremor   . Urinary urgency     Past Surgical History:  Procedure Laterality Date  . ANKLE ARTHROSCOPY Left    x 2  . ANKLE FUSION Left   . CARPAL TUNNEL RELEASE N/A 07/24/2015   Procedure: Fiducial placement for deep brain stimulator;  Surgeon: Erline Levine, MD;  Location: [place] [facility];  Service: Neurosurgery;  Laterality: N/A;  Fiducial placement for deep brain stimulator  . CIRCUMCISION    . COLONOSCOPY    .  EYE SURGERY Bilateral    blepharoplasty  . PULSE GENERATOR IMPLANT Left 08/07/2015   Procedure: UNILATERAL PULSE GENERATOR IMPLANT (LEFT);  Surgeon: Erline Levine, MD;  Location: [place] [facility];  Service: Neurosurgery;  Laterality: Left;  UNILATERAL PULSE GENERATOR IMPLANT (LEFT)  . PULSE GENERATOR IMPLANT Right 07/01/2016   Procedure: RIGHT IMPLANTABLE PULSE GENERATOR PLACEMENT;  Surgeon: Erline Levine, MD;  Location: [place];  Service: Neurosurgery;  Laterality: Right;  RIGHT IMPLANTABLE PULSE GENERATOR PLACEMENT  . SUBTHALAMIC STIMULATOR INSERTION Left  07/31/2015   Procedure: LEFT DEEP BRAIN STIMULATOR INSERTION;  Surgeon: Erline Levine, MD;  Location: [place] [facility];  Service: Neurosurgery;  Laterality: Left;  LEFT DEEP BRAIN STIMULATOR INSERTION  . SUBTHALAMIC STIMULATOR INSERTION Right 06/24/2016   Procedure: RIGHT DEEP BRAIN STIMULATOR PLACEMENT;  Surgeon: Erline Levine, MD;  Location: [place];  Service: Neurosurgery;  Laterality: Right;  RIGHT DEEP BRAIN STIMULATOR PLACEMENT  . Thumb surgery Left    ligament    Social History   Social History  . Marital status: Married    Spouse name: N/A  . Number of children: N/A  . Years of education: N/A   Occupational History  . Not on file.   Social History Main Topics  . Smoking status: Former Smoker    Years: 24.00  . Smokeless tobacco: Never Used     Comment: quit smoking in 1984  . Alcohol use No  . Drug use: No  . Sexual activity: Not on file   Other Topics Concern  . Not on file   Social History Narrative  . No narrative on file    Family Status  Relation Status  . Mother Deceased   HTN  . Father Deceased   HTN  . Brother Alive    Review of Systems  A complete 10 system ROS was obtained and was negative apart from what is mentioned.   Objective:   VITALS:   There were no vitals filed for this visit. Gen:  Appears stated age and in NAD. HEENT:  Normocephalic, atraumatic. The mucous membranes are moist. The superficial temporal arteries are without ropiness or tenderness. Cardiovascular: Regular rate and rhythm. Lungs: Clear to auscultation bilaterally. Neck: There are no carotid bruits noted bilaterally.  NEUROLOGICAL:  Orientation:  The patient is alert and oriented x 3.   Cranial nerves: There is good facial symmetry.  Speech is fluent but dysarthric before programming. Soft palate rises symmetrically and there is no tongue deviation. Hearing is intact to conversational tone. Tone: Tone is good throughout. Sensation: Sensation is intact to light touch  throughout Coordination:  The patient has no dysdiadichokinesia or dysmetria. Motor: Strength is at least antigravity 4.  Shoulder shrug is equal bilaterally.  There is no pronator drift.  There are no fasciculations noted.   MOVEMENT EXAM: Tremor:  No tremor in the R hand.  Very minimal tremor of the L hand.  DBS programming performed today and described on separate procedural note.  In brief, following programming there was improvement in speech and he was no longer dysarthric.  He did have some tremor on the left hand following programming, but it was very mild.     Assessment/Plan:   1.  Essential Tremor.  -The patient is status post DBS surgery to the left VIM on 07/31/2015.  He had his generator placed on 08/07/2015.  -The patient underwent right VIM DBS on 06/24/2016.  He had his IPG placed on 07/01/2016.This lead may be slightly anterior.  He has had less  efficacy and more side effect issues with this side.  He has had significant number of falls.  We had discussed extensively the fall risk when doing bilateral VIM cases, which he understood prior to having this side done.  I stressed to the patient and his family that it is never okayed to sacrifice balance for tremor control.  I did reprogram the side again today, but we may need to deactivate this side if issues continue.For today, I significantly dropped down the frequency and increased the amplitude.  They will monitor for any gait change.  I do not think that anyone wants to have a revision of this side. 2.  Diabetic peripheral neuropathy  -I do think that this the main reason for loss of balance.  We discussed control of diabetes.    -had several falls and will send for PT 3.  Nausea, following surgery  -He has had nausea following first surgery in 2017.  This seemed to improve somewhat after reprogramming his device, but also seemed to improve following treatments for reflux and watching the diet.  He still has issues if he  overeats.  It is markedly better than it was. 4.  Syncope  -wonder if related to Blood sugar  -gave [place] driving laws  -hold driving x 6 months from 07/2016.  5.  Much greater than 50% of this visit was spent in counseling and coordinating care.  Total face to face time:  15 min but most spent programming DBS and that was an additional 25 min

## 2016-09-07 NOTE — Telephone Encounter (Signed)
Patient made aware of CT date/time.

## 2016-09-07 NOTE — Telephone Encounter (Signed)
Can we go ahead and get a CT brain without before Monday to make sure that not missing anything

## 2016-09-07 NOTE — Telephone Encounter (Signed)
Find out if Logan Griffith doing better (in terms of falling and tremor) since seen on Monday.  He was going to decide if he still wanted to keep f/u next Monday or not based on this.

## 2016-09-07 NOTE — Telephone Encounter (Signed)
Spoke with patient's wife (he was not home). She states from her point of view he isn't better. Still off balance. She also states that speech is worse and he is "not himself". She doesn't know quite how to elaborate on him not being himself just that her and their children have noticed a big change in him.  She states that most of the time he just sits in a chair and sleeps most of the day.  They will keep appt on Monday, but I let her know I would pass this on to you.

## 2016-09-08 ENCOUNTER — Ambulatory Visit (INDEPENDENT_AMBULATORY_CARE_PROVIDER_SITE_OTHER)
Admission: RE | Admit: 2016-09-08 | Discharge: 2016-09-08 | Disposition: A | Discharge status: Home / Self Care | Payer: Medicare Other | Source: Ambulatory Visit | Attending: Neurology | Admitting: Neurology

## 2016-09-08 DIAGNOSIS — R2681 Unsteadiness on feet: Secondary | ICD-10-CM | POA: Diagnosis not present

## 2016-09-08 DIAGNOSIS — Z9689 Presence of other specified functional implants: Secondary | ICD-10-CM

## 2016-09-08 DIAGNOSIS — R4182 Altered mental status, unspecified: Secondary | ICD-10-CM

## 2016-09-09 ENCOUNTER — Telehealth: Payer: Self-pay | Admitting: Neurology

## 2016-09-09 NOTE — Telephone Encounter (Signed)
-----   Message from Alda Berthold, DO sent at 09/08/2016  5:49 PM EDT ----- Please inform patient that CT head is stable, no new findings.  Thanks.

## 2016-09-09 NOTE — Telephone Encounter (Signed)
LMOM making patient aware CT okay and will follow up with him on Monday.

## 2016-09-12 ENCOUNTER — Ambulatory Visit (INDEPENDENT_AMBULATORY_CARE_PROVIDER_SITE_OTHER): Payer: Medicare Other | Admitting: Neurology

## 2016-09-12 ENCOUNTER — Encounter: Payer: Self-pay | Admitting: Neurology

## 2016-09-12 VITALS — BP 112/70 | HR 85 | Ht 73.0 in | Wt 257.0 lb

## 2016-09-12 DIAGNOSIS — G250 Essential tremor: Secondary | ICD-10-CM | POA: Diagnosis not present

## 2016-09-12 NOTE — Procedures (Signed)
DBS Programming was performed.    Total time spent programming was 25 min.  Device was confirmed to be on.  Soft start was on at 8.  Impedences were checked and were within normal limits.  Battery was checked and was determined to be functioning normally and not near the end of life.  Final settings were as follows:  Left brain electrode:     1-2+           ; Amplitude  2.9   V   ; Pulse width 90 microseconds;   Frequency   160   Hz.  Right brain electrode Group A:   0-C+     ; Amplitude: 4.5V; pulse width 150 microsec; frequency 120 HZ  Right brain electrode Group B:   2-C+         ; Amplitude  4.0   V   ; Pulse width 100 microseconds;   Frequency   150   Hz.     Pt left on group A.

## 2016-10-14 NOTE — Progress Notes (Signed)
Subjective:   Logan Griffith was seen in consultation in the movement disorder clinic at the request of Center, [facility].  The evaluation is for tremor.  The patient is seen in consultation at the request of Dr. Jannifer Franklin.  I have reviewed her notes and appreciate those.  This patient is accompanied in the office by his spouse and child who supplement the history.  The patient has had essential tremor for approximately 5 years according to records.   It has been worse over the last 2 years.  It is in both hands.  He notices it most in the R hand but he is R hand dominant.  He has trouble writing checks and eating sounds.  The patient is on primidone, 250 mg daily.  He has never been on a higher dosage than this but he indicates that it is not helpful.  The patient states he is on labetalol, 300 mg twice a day.  He was told that he could take a higher dosage of this to see if that helped his tremor, but he has not done that.  Pt states that the labetolol is for BP, however.   He has noticed no benefit with the primidone.  Unfortunately, these medications are not helping.  He does have a history of kidney stones, and therefore is not able to have Topamax.  There is no family hx of tremor.  He comes in today looking for an opinion regarding DBS therapy.   Affected by caffeine:  No. (1-2 cups coffee/day - 18 oz each; 1-2 diet soda's a day) Affected by alcohol:  No. Affected by stress:  No. Affected by fatigue:  No. Spills soup if on spoon:  Yes.   Spills glass of liquid if full:  Yes.   Affects ADL's (tying shoes, brushing teeth, etc):  Yes.   (has to shave with 2 hands with blade)  Current/Previously tried tremor medications: primidone; beta blocker for BP  Outside reports reviewed: historical medical records and office notes.  04/07/15 update:  The patient returns today, accompanied by his wife and son who supplement the history.  The patient had neuropsych testing done by Dr. Richrd Sox on  03/18/2015.  She felt that he would be a good DBS candidate.  No evidence of dementia.  The patient remains on primidone, 250 mg twice a day for his tremor.  He is also on labetalol 300 mg twice a day but that is primarily for his blood pressure.  He has a history of nephrolithiasis and cannot take Topamax.  He remains interested in DBS therapy.  06/18/15 update: The patient follows up with me today, and final preparation for his DBS surgery.  It is scheduled for 07/30/2014.  He remains on primidone, 250 mg but is down to once a day.  He has seen Dr. Vertell Limber and reviewed logistics of surgery with him.  He expresses desire to proceed with DBS therapy.  09/04/15 update:  The patient is following up today, accompanied by his wife, son, daughter in law who supplement the history.  The patient is status post DBS surgery to the left VIM on 07/31/2015.  He had his generator placed on 08/07/2015.  He did have difficulty voiding after the surgery and did have to leave the hospital with a Foley in place.  He has had some nausea following surgery.    09/11/15 update:  The patient is following up today, accompanied by his wife who supplements the history.  The patient reports that  he has been doing well this week.  He is happy to report that he has written checks from the checkbook for the first time in a long time.  He also states that he has been shave with one hand.  He asks me about potentially having DBS for the left hand.  He continues to have some nausea.  I have reviewed records from his primary care provider from [place].  They started him on ranitidine.  He is not sure if it is helping.  He has episodes of emesis up to 3 times per week.  This seemed to start after surgery.  11/19/15 update:  The patient is following up today for his ET.  The patient is status post DBS surgery to the left VIM on 07/31/2015.  He had his generator placed on 08/07/2015.  He had places froze on his arms and legs.  Nausea is much better  than previous but "it has been a long while."  He thinks that is related to eating too much.  Overall, very happy with DBS.  No significant tremor on the right hand.  He saw Dr. Vertell Limber yesterday and pt mentioned that he is thinking about doing DBS on the other side.    03/23/16 update:  Pt f/u for ET.  He is doing well with regards to tremor in the right hand.  He wants to do DBS on the other side.  Asks me if we can get it done after the first of the year.  He eats in the living room and holds the glass in one hand and has trouble holding food with the other hand.   In regards to nausea he states that he has been doing well.  He had a few falls trying to tie his shoes and fell out of the chair (just slipped off the cushion).  He is in PT now.  07/21/16 update:  Patient follows up today, accompanied by his wife and son who supplement the history.  The patient underwent right VIM DBS on 06/24/2016.  He had his IPG placed on 07/01/2016.  Postoperative MRI showed expected lead placement.  Patient reports that he has recovered well.  He did fall 3 times in one day.  He fell over a chair.  He then was at a restaurant and spilled his drink and slipped on the drink.  He also fell outside that day.  Been to the doctor several times as he hurt his shoulder.    08/05/16 update:  Patient follows up today, accompanied by his son who supplements the history.  Patient's device was activated somewhat 2 weeks ago.  He continues to have some tremor in the left hand.  He has had no falls since our last visit.  He has not had any nausea.  He does tell me after our last visit he had a passing out episode.  Was getting out of the car and passed out for few seconds.  No warning.  No loss bladder/bowel control.  No confusion after.  No palpitations.  Didn't check BP or BS after the event.  He had not eaten at all and it was past lunch time (he is diabetic).    09/12/16 update: Patient follows up today, accompanied by his wife who  supplements the history.  Patient's wife had told us last week (09/05/2016) that he was having a significant number of falls since changes were made to his DBS.  He worked hard on trying to reprogram the device  at that he had efficacy without falls.  His wife feels that he has had personality change and speech change.  Because of that, a CT of the brain was done and it was unremarkable.  He has not had any falls since the reprogramming a week ago but has had near falls.    10/17/16 update:  Patient seen today, accompanied by his wife who supplements the history.  Last visit, I significantly dropped down the frequency and increased the amplitude of the DBS settings on the right brain, hoping that it would help with falls.  We also sent him to physical therapy.  It doesn't appear that he attended that and he last went to that in October.  However, he has not had further falls.  Wife does think that getting him suspenders for pants helped because has 2 hands to walk for balance now and not holding pants up with one hand.   No further syncopal episodes.  Tremor has been good.  Speech continues to be an issue for wife and others trying to understand him on the phone.  Allergies  Allergen Reactions  . No Known Allergies     Outpatient Encounter Prescriptions as of 10/17/2016  Medication Sig  . amLODipine (NORVASC) 10 MG tablet Take 10 mg by mouth daily.  Marland Kitchen atorvastatin (LIPITOR) 40 MG tablet Take 40 mg by mouth daily.  . beta carotene w/minerals (OCUVITE) tablet Take 1 tablet by mouth daily.  . clotrimazole (LOTRIMIN) 1 % cream Apply 1 application topically 2 (two) times daily.  . empagliflozin (JARDIANCE) 25 MG TABS tablet Take 25 mg by mouth daily.  . finasteride (PROSCAR) 5 MG tablet Take 5 mg by mouth daily.   Marland Kitchen gabapentin (NEURONTIN) 600 MG tablet Take 600 mg by mouth 2 (two) times daily.  Marland Kitchen gemfibrozil (LOPID) 600 MG tablet Take 600 mg by mouth 2 (two) times daily before a meal.  . glimepiride  (AMARYL) 4 MG tablet Take 4 mg by mouth daily with breakfast.  . labetalol (NORMODYNE) 100 MG tablet Take 100 mg by mouth 2 (two) times daily.  Marland Kitchen linagliptin (TRADJENTA) 5 MG TABS tablet Take 5 mg by mouth daily.  Marland Kitchen losartan-hydrochlorothiazide (HYZAAR) 100-25 MG per tablet Take 1 tablet by mouth daily.  . pimecrolimus (ELIDEL) 1 % cream Apply 1 application topically daily as needed (for psoriasis).  . Polyvinyl Alcohol-Povidone (REFRESH OP) Apply 1 drop to eye daily as needed (dry eyes).  . sertraline (ZOLOFT) 100 MG tablet Take 100 mg by mouth daily.  . tamsulosin (FLOMAX) 0.4 MG CAPS capsule Take 0.4 mg by mouth daily.   Nelva Nay SOLOSTAR 300 UNIT/ML SOPN Inject 56 Units into the skin at bedtime.   . vitamin B-12 (CYANOCOBALAMIN) 1000 MCG tablet Take 1,000 mcg by mouth daily.  . [DISCONTINUED] HYDROcodone-acetaminophen (NORCO/VICODIN) 5-325 MG tablet Take 1-2 tablets by mouth every 4 (four) hours as needed (mild pain).   No facility-administered encounter medications on file as of 10/17/2016.     Past Medical History:  Diagnosis Date  . Arthritis   . BPH (benign prostatic hyperplasia)    takes Finasteride and Flomax daily  . Depression    takes Zoloft daily  . Diabetes ([place])    takes Toujeo,Jardiance,Amaryl,and Tradjenta daily  . Eczema    uses a cream  . History of colon polyps    benign  . History of kidney stones   . Hyperlipemia    takes Atorvastatin daily as well as Lopid  . Hypertension  takes Amlodipine,Labetalol daily as well as Losartan-HCTZ  . Low back pain    sciatica  . Nocturia   . Peripheral neuropathy   . Peripheral neuropathy    takes Gabapentin daily  . Psoriasis   . Sleep apnea   . Tremor   . Urinary urgency     Past Surgical History:  Procedure Laterality Date  . ANKLE ARTHROSCOPY Left    x 2  . ANKLE FUSION Left   . CARPAL TUNNEL RELEASE N/A 07/24/2015   Procedure: Fiducial placement for deep brain stimulator;  Surgeon: Erline Levine, MD;   Location: [facility];  Service: Neurosurgery;  Laterality: N/A;  Fiducial placement for deep brain stimulator  . CIRCUMCISION    . COLONOSCOPY    . EYE SURGERY Bilateral    blepharoplasty  . PULSE GENERATOR IMPLANT Left 08/07/2015   Procedure: UNILATERAL PULSE GENERATOR IMPLANT (LEFT);  Surgeon: Erline Levine, MD;  Location: [facility];  Service: Neurosurgery;  Laterality: Left;  UNILATERAL PULSE GENERATOR IMPLANT (LEFT)  . PULSE GENERATOR IMPLANT Right 07/01/2016   Procedure: RIGHT IMPLANTABLE PULSE GENERATOR PLACEMENT;  Surgeon: Erline Levine, MD;  Location: [facility];  Service: Neurosurgery;  Laterality: Right;  RIGHT IMPLANTABLE PULSE GENERATOR PLACEMENT  . SUBTHALAMIC STIMULATOR INSERTION Left 07/31/2015   Procedure: LEFT DEEP BRAIN STIMULATOR INSERTION;  Surgeon: Erline Levine, MD;  Location: [facility];  Service: Neurosurgery;  Laterality: Left;  LEFT DEEP BRAIN STIMULATOR INSERTION  . SUBTHALAMIC STIMULATOR INSERTION Right 06/24/2016   Procedure: RIGHT DEEP BRAIN STIMULATOR PLACEMENT;  Surgeon: Erline Levine, MD;  Location: [facility];  Service: Neurosurgery;  Laterality: Right;  RIGHT DEEP BRAIN STIMULATOR PLACEMENT  . Thumb surgery Left    ligament    Social History   Social History  . Marital status: Married    Spouse name: N/A  . Number of children: N/A  . Years of education: N/A   Occupational History  . Not on file.   Social History Main Topics  . Smoking status: Former Smoker    Years: 24.00  . Smokeless tobacco: Never Used     Comment: quit smoking in 1984  . Alcohol use No  . Drug use: No  . Sexual activity: Not on file   Other Topics Concern  . Not on file   Social History Narrative  . No narrative on file    Family Status  Relation Status  . Mother Deceased       HTN  . Father Deceased       HTN  . Brother Alive    Review of Systems  A complete 10 system ROS was obtained and was negative apart from what is mentioned.   Objective:   VITALS:   Vitals:     10/17/16 1438  BP: 130/64  Pulse: 84  SpO2: 93%  Weight: 252 lb (114.3 kg)  Height: 6\' 1"  (1.854 m)   Gen:  Appears stated age and in NAD. HEENT:  Normocephalic, atraumatic. The mucous membranes are moist. The superficial temporal arteries are without ropiness or tenderness. Cardiovascular: Regular rate and rhythm. Lungs: Clear to auscultation bilaterally. Neck: There are no carotid bruits noted bilaterally.  NEUROLOGICAL:  Orientation:  The patient is alert and oriented x 3.   Cranial nerves: There is good facial symmetry.  Speech is fluent but mildly dysarthric (still understandable). Soft palate rises symmetrically and there is no tongue deviation. Hearing is intact to conversational tone. Tone: Tone is good throughout. Sensation: Sensation is intact to light  touch throughout Coordination:  The patient has no dysdiadichokinesia or dysmetria. Motor: Strength is at least antigravity 4.  Shoulder shrug is equal bilaterally.  There is no pronator drift.  There are no fasciculations noted.   MOVEMENT EXAM: Tremor:  No tremor in either hand  DBS programming performed today and described on separate procedural note.       Assessment/Plan:   1.  Essential Tremor.  -The patient is status post DBS surgery to the left VIM on 07/31/2015.  He had his generator placed on 08/07/2015.  -The patient underwent right VIM DBS on 06/24/2016.  He had his IPG placed on 07/01/2016.This lead may be slightly anterior.  He has had less efficacy and more side effect issues with this side.  He has had significant number of falls.  By turning down frequency we have eliminated falls and now have tremor control but he is still having some speech issues.  Reprogrammed today.  RX for ST 2.  Diabetic peripheral neuropathy  -I do think that this the main reason for loss of balance.  We discussed control of diabetes.    -had several falls and will send for PT 3.  Nausea, following surgery  -He has had nausea  following first surgery in 2017.  This seemed to improve somewhat after reprogramming his device, but also seemed to improve following treatments for reflux and watching the diet.  He still has issues if he overeats.  It is markedly better than it was. 4.  Syncope  -wonder if related to Blood sugar  -gave [place] driving laws  -hold driving x 6 months from 07/2016.  5.  Much greater than 50% of this visit was spent in counseling and coordinating care.  Total face to face time:  15 min but most spent programming DBS and that was an additional 20 min

## 2016-10-17 ENCOUNTER — Ambulatory Visit (INDEPENDENT_AMBULATORY_CARE_PROVIDER_SITE_OTHER): Payer: Medicare Other | Admitting: Neurology

## 2016-10-17 ENCOUNTER — Encounter: Payer: Self-pay | Admitting: Neurology

## 2016-10-17 VITALS — BP 130/64 | HR 84 | Ht 73.0 in | Wt 252.0 lb

## 2016-10-17 DIAGNOSIS — R2681 Unsteadiness on feet: Secondary | ICD-10-CM | POA: Diagnosis not present

## 2016-10-17 DIAGNOSIS — G250 Essential tremor: Secondary | ICD-10-CM | POA: Diagnosis not present

## 2016-10-17 DIAGNOSIS — R1310 Dysphagia, unspecified: Secondary | ICD-10-CM | POA: Diagnosis not present

## 2016-10-17 NOTE — Procedures (Signed)
DBS Programming was performed.    Total time spent programming was 20 min.  Device was confirmed to be on.  Soft start was on at 8.  Impedences were checked and were within normal limits.  Battery was checked and was determined to be functioning normally and not near the end of life.  Final settings were as follows:  Left brain electrode:     1-2+           ; Amplitude  2.9   V   ; Pulse width 90 microseconds;   Frequency   160   Hz.  Right brain electrode Group A:   0-C+     ; Amplitude: 4.3 V; pulse width 150 microsec; frequency 120 HZ  Right brain electrode Group B:   2-C+         ; Amplitude  4.0   V   ; Pulse width 100 microseconds;   Frequency   150   Hz.     Pt left on group A.

## 2016-10-18 ENCOUNTER — Telehealth: Payer: Self-pay | Admitting: Neurology

## 2016-10-18 NOTE — Telephone Encounter (Signed)
Referral faxed to [facility] for physical and speech therapy to [phone] with confirmation received. They will contact patient to schedule.

## 2016-10-18 NOTE — Addendum Note (Signed)
Addended byAnnamaria Helling on: 10/18/2016 07:57 AM   Modules accepted: Orders

## 2016-11-01 ENCOUNTER — Telehealth: Payer: Self-pay | Admitting: Neurology

## 2016-11-01 NOTE — Telephone Encounter (Signed)
Received note from [facility] that patient is scheduled for therapy on 11/04/16.

## 2016-12-13 ENCOUNTER — Telehealth: Payer: Self-pay | Admitting: Neurology

## 2016-12-13 NOTE — Telephone Encounter (Signed)
Left message on machine for patient to call back.

## 2016-12-13 NOTE — Telephone Encounter (Signed)
Spoke with wife and gave advise on turning off most recent side. She is nervous about doing this herself. I advised they could do this at home but offered them a nurse visit for me to turn it off if needed. She will call back and let me know if they are coming to the office.

## 2016-12-13 NOTE — Telephone Encounter (Signed)
Spoke with patient's wife and she has ongoing concerns with balance and speech.   She states he has almost completed therapies and still no better. He is falling an average of 1-2 times a week (he fell 3 times this weekend). No LOC. He has not had any injuries and hasn't hit his head.   She states that the patient just doesn't talk anymore. He will barely say anything. She doesn't know if this is related to speech issues or depression. She has asked him about being depressed but he will just look at her and not answer.   Patient has also had an additional cough along with nausea and speech trouble the past few weeks. Speech therapist had suggested a visit with GI to evaluate since MBE in June was okay. I asked about him taking reflux medication since last note states nausea was better when he was taking it. The wife states she does not think he is currently taking. I advised if he restarts reflux medication it may help but will run all these concerns by Dr. Carles Collet.   Patient's wife asking for sooner appt. Dr. Carles Collet please advise.

## 2016-12-13 NOTE — Telephone Encounter (Signed)
PT's wife Stanton Kidney left a voicemail message asking for a call back from [facility] but did not state why

## 2016-12-13 NOTE — Telephone Encounter (Signed)
Why don't we just turn off the side most recently completed and see if he does better clinically.  Do they know how to do that with their remote?

## 2016-12-13 NOTE — Telephone Encounter (Signed)
Patient's wife Stanton Kidney returning your call. Thanks

## 2016-12-14 ENCOUNTER — Ambulatory Visit: Payer: Medicare Other | Admitting: Neurology

## 2016-12-14 ENCOUNTER — Emergency Department (HOSPITAL_COMMUNITY): Payer: Medicare Other

## 2016-12-14 ENCOUNTER — Ambulatory Visit (INDEPENDENT_AMBULATORY_CARE_PROVIDER_SITE_OTHER): Payer: Medicare Other | Admitting: Neurology

## 2016-12-14 ENCOUNTER — Emergency Department (HOSPITAL_COMMUNITY)
Admission: EM | Admit: 2016-12-14 | Discharge: 2016-12-14 | Disposition: A | Discharge status: Home / Self Care | Payer: Medicare Other | Attending: Emergency Medicine | Admitting: Emergency Medicine

## 2016-12-14 ENCOUNTER — Other Ambulatory Visit: Payer: Self-pay

## 2016-12-14 ENCOUNTER — Encounter: Payer: Self-pay | Admitting: Neurology

## 2016-12-14 VITALS — Temp 98.1°F

## 2016-12-14 DIAGNOSIS — I10 Essential (primary) hypertension: Secondary | ICD-10-CM | POA: Diagnosis not present

## 2016-12-14 DIAGNOSIS — R2681 Unsteadiness on feet: Secondary | ICD-10-CM | POA: Diagnosis not present

## 2016-12-14 DIAGNOSIS — R29898 Other symptoms and signs involving the musculoskeletal system: Secondary | ICD-10-CM

## 2016-12-14 DIAGNOSIS — Z9689 Presence of other specified functional implants: Secondary | ICD-10-CM

## 2016-12-14 DIAGNOSIS — Z87891 Personal history of nicotine dependence: Secondary | ICD-10-CM | POA: Insufficient documentation

## 2016-12-14 DIAGNOSIS — R4701 Aphasia: Secondary | ICD-10-CM | POA: Diagnosis present

## 2016-12-14 DIAGNOSIS — R269 Unspecified abnormalities of gait and mobility: Secondary | ICD-10-CM

## 2016-12-14 DIAGNOSIS — E119 Type 2 diabetes mellitus without complications: Secondary | ICD-10-CM | POA: Diagnosis not present

## 2016-12-14 DIAGNOSIS — Z79899 Other long term (current) drug therapy: Secondary | ICD-10-CM | POA: Diagnosis not present

## 2016-12-14 DIAGNOSIS — G250 Essential tremor: Secondary | ICD-10-CM | POA: Diagnosis not present

## 2016-12-14 LAB — DIFFERENTIAL
BASOS PCT: 0 %
Basophils Absolute: 0 10*3/uL (ref 0.0–0.1)
EOS ABS: 0.3 10*3/uL (ref 0.0–0.7)
Eosinophils Relative: 3 %
Lymphocytes Relative: 9 %
Lymphs Abs: 0.8 10*3/uL (ref 0.7–4.0)
MONO ABS: 0.7 10*3/uL (ref 0.1–1.0)
MONOS PCT: 8 %
Neutro Abs: 7 10*3/uL (ref 1.7–7.7)
Neutrophils Relative %: 80 %

## 2016-12-14 LAB — CBC
HEMATOCRIT: 41.4 % (ref 39.0–52.0)
Hemoglobin: 14 g/dL (ref 13.0–17.0)
MCH: 32.6 pg (ref 26.0–34.0)
MCHC: 33.8 g/dL (ref 30.0–36.0)
MCV: 96.5 fL (ref 78.0–100.0)
Platelets: 180 10*3/uL (ref 150–400)
RBC: 4.29 MIL/uL (ref 4.22–5.81)
RDW: 15 % (ref 11.5–15.5)
WBC: 8.7 10*3/uL (ref 4.0–10.5)

## 2016-12-14 LAB — COMPREHENSIVE METABOLIC PANEL
ALT: 22 U/L (ref 17–63)
AST: 16 U/L (ref 15–41)
Albumin: 3.8 g/dL (ref 3.5–5.0)
Alkaline Phosphatase: 82 U/L (ref 38–126)
Anion gap: 14 (ref 5–15)
BUN: 25 mg/dL — ABNORMAL HIGH (ref 6–20)
CHLORIDE: 103 mmol/L (ref 101–111)
CO2: 21 mmol/L — ABNORMAL LOW (ref 22–32)
CREATININE: 1.6 mg/dL — AB (ref 0.61–1.24)
Calcium: 9.4 mg/dL (ref 8.9–10.3)
GFR, EST AFRICAN AMERICAN: 47 mL/min — AB (ref >60)
GFR, EST NON AFRICAN AMERICAN: 40 mL/min — AB (ref >60)
Glucose, Bld: 177 mg/dL — ABNORMAL HIGH (ref 65–99)
POTASSIUM: 3.7 mmol/L (ref 3.5–5.1)
Sodium: 138 mmol/L (ref 135–145)
Total Bilirubin: 0.5 mg/dL (ref 0.3–1.2)
Total Protein: 6.7 g/dL (ref 6.5–8.1)

## 2016-12-14 LAB — URINALYSIS, ROUTINE W REFLEX MICROSCOPIC
Bilirubin Urine: NEGATIVE
HGB URINE DIPSTICK: NEGATIVE
Ketones, ur: NEGATIVE mg/dL
NITRITE: NEGATIVE
PROTEIN: 100 mg/dL — AB
Specific Gravity, Urine: 1.03 (ref 1.005–1.030)
pH: 5 (ref 5.0–8.0)

## 2016-12-14 LAB — APTT: aPTT: 30 seconds (ref 24–36)

## 2016-12-14 LAB — PROTIME-INR
INR: 1.09
Prothrombin Time: 14.1 seconds (ref 11.4–15.2)

## 2016-12-14 NOTE — ED Triage Notes (Addendum)
Pt in via neurologist for eval for concerns versus worsening falls & slurred speech, family reports pt treated recently for tremors, pt has medtronics stimulators in place per family, pt lethargic and having dysphasia, pt followed by Tatt, MD, pt follows commands, alert to person, onset x 1 wk, unknown specific time

## 2016-12-14 NOTE — Procedures (Signed)
DBS Programming was performed.    Total time spent programming was 20 min.  Device was confirmed to be on.  Soft start was on at 8.  Impedences were checked and were within normal limits.  Battery was checked and was determined to be functioning normally and not near the end of life.  Final settings were as follows:  Left brain electrode:     1-2+           ; Amplitude  2.9   V   ; Pulse width 90 microseconds;   Frequency   160   Hz.  Right brain electrode Group A:   0-C+     ; Amplitude: 4.3 V; pulse width 150 microsec; frequency 120 HZ  Right brain electrode Group B:   2-C+         ; Amplitude  4.0   V   ; Pulse width 100 microseconds;   Frequency   150   Hz.     Pt left on group A. But right brain electrode turned OFF before patient left

## 2016-12-14 NOTE — Consult Note (Addendum)
Neurology Consultation Reason for Consult: Gait difficulty Referring Physician: Oleta Mouse, D  CC: Gait difficulty  History is obtained from: Patient, family  HPI: Logan Griffith is a 76 y.o. male with a history of essential tremor who underwent deep brain stimulation and has been having progressive worsening of his gait and falls. His son notes that over the past week he has appeared worse, and therefore they went to see Dr. Carles Collet today.  Prior to her evaluation, he walked into the office and had his right-sided stimulator turned off. Following this, he was unable to walk. Dr. Carles Collet evaluated him and felt like his left leg was weaker than typical and was concerned for stroke and therefore sent him to the emergency department.  The deficits that he has, speech difficulty, left leg weakness, difficulty walking are all problems that are long-standing, just worse over the past week. Of note, he has had a chest cold during this time.   ROS: A 14 point ROS was performed and is negative except as noted in the HPI.   Past Medical History:  Diagnosis Date  . Arthritis   . BPH (benign prostatic hyperplasia)    takes Finasteride and Flomax daily  . Depression    takes Zoloft daily  . Diabetes ([place])    takes Toujeo,Jardiance,Amaryl,and Tradjenta daily  . Eczema    uses a cream  . History of colon polyps    benign  . History of kidney stones   . Hyperlipemia    takes Atorvastatin daily as well as Lopid  . Hypertension    takes Amlodipine,Labetalol daily as well as Losartan-HCTZ  . Low back pain    sciatica  . Nocturia   . Peripheral neuropathy   . Peripheral neuropathy    takes Gabapentin daily  . Psoriasis   . Sleep apnea   . Tremor   . Urinary urgency      Family history: No history of similar   Social History:  reports that he has quit smoking. He quit after 24.00 years of use. He has never used smokeless tobacco. He reports that he does not drink alcohol or use  drugs.   Exam: Current vital signs: BP (!) 141/65   Pulse 63   Temp 98.1 F (36.7 C)   Resp 18   Ht 6\' 1"  (1.854 m)   Wt 119.3 kg (263 lb)   SpO2 94%   BMI 34.70 kg/m  Vital signs in last 24 hours: Temp:  [98.1 F (36.7 C)-98.3 F (36.8 C)] 98.1 F (36.7 C) (07/11 2053) Pulse Rate:  [63-85] 63 (07/11 2230) Resp:  [15-18] 18 (07/11 2145) BP: (138-164)/(63-90) 141/65 (07/11 2230) SpO2:  [92 %-98 %] 94 % (07/11 2230) Weight:  [119.3 kg (263 lb)] 119.3 kg (263 lb) (07/11 1635)   Physical Exam  Constitutional: Appears well-developed and well-nourished.  Psych: Affect appropriate to situation Eyes: No scleral injection HENT: No OP obstrucion Head: Normocephalic.  Cardiovascular: Normal rate and regular rhythm.  Respiratory: Effort normal and breath sounds normal to anterior ascultation GI: Soft.  No distension. There is no tenderness.  Skin: WDI  Neuro: Mental Status: Patient is awake, alert, oriented to person, place, month, year, and situation. Patient is able to give a clear and coherent history. He does appear to have word finding difficulty, and his speech is slow. Cranial Nerves: II: Visual Fields are full. Pupils are equal, round, and reactive to light.   III,IV, VI: EOMI without ptosis or diploplia.  V: Facial sensation  is symmetric to temperature VII: Facial movement is symmetric.  VIII: hearing is intact to voice X: Uvula elevates symmetrically XI: Shoulder shrug is symmetric. XII: tongue is midline without atrophy or fasciculations.  Motor: Tone is normal. Bulk is normal. 5/5 strength was present in bilateral arms, he has 4+/5 strength proximally in his left leg Sensory: Sensation is symmetric to light touch and temperature in the arms and legs. Deep Tendon Reflexes: 2+ and symmetric in the biceps. Though his patellar reflexes are 2+, he has a crossed abductor response in his left leg Plantars: Toes are downgoing bilaterally.  Cerebellar: He has  essential tremor present on the left.   I have reviewed labs in epic and the results pertinent to this consultation are: Borderline elevated creatinine  I have reviewed the images obtained: CT head-negative  Impression: 76 year old male with worsening gait in the setting of bilateral DBS. With the duration of his symptoms, I would expect it to be present on CT and after discussion with radiology the only sequence that we could obtain would be an axial T1 which I do not think will be any more helpful than his CT. This is only available on the brain and T1 imaging is not available of the spine with his DBS.  One possible etiology of progressive gait dysfunction as well as stenosis and the fact that he has a crossed adductor of his left leg does raise the possibility that this could be contributory, though index of suspicion is still relatively low. An MRI would be ideal, but in the absence of this time I would favor doing a CT of this C and T-spine.  Given that all of his symptoms are long-standing, I'm not certain why he did not lift his leg as well for Dr. Carles Collet as he did for me, but wonder if it did have something to do with the modification of his DBS settings. In this time, I have low suspicion for TIA or stroke.  Recommendations: 1) CT scan of the C and T-spine 2) if negative, treatment would be physical therapy. 3) could consider admission for PT,OT evaluation to assure safety at home.   Roland Rack, MD [facility] [phone]  If 7pm- 7am, please page neurology on call as listed in [place].

## 2016-12-14 NOTE — Discharge Instructions (Signed)
Our neurologist recommended CT of the cervical spine and thoracic spine to rule out other potential causes of your symptoms. We recommend that you let your primary care doctor or neurologist order this for you since you did not get it during your ED visit today. Due to  your Deep brain stimulators, we are not able to get the necessary MRIs for evaluation. Our neurologist feels that is very unlikely that your symptoms are related to stroke.  You have been ordered home health for nursing care, physical therapy and social worker. This will be set up over the next 1-2 days. Your social worker will help you with placement into rehab facility if this is what you want.  Return for any worsening symptoms, including confusion, fever, or any other symptoms concerning to you.

## 2016-12-14 NOTE — ED Provider Notes (Signed)
[facility] DEPT Provider Note   CSN: [id number] Arrival date & time: 12/14/16  1538     History   Chief Complaint Chief Complaint  Patient presents with  . Aphasia    HPI Logan Griffith is a 76 y.o. male.  HPI  76 year old male who presents with gait instability and expressive aphasia. He has a history of hypertension, hyperlipidemia and diabetes. Also history of essential tremor with bilateral deep brain stimulators placed in February 2017 on the left and January 2018 on the right. Over the past 2 months he has had gradual worsening of symptoms, including speech difficulty and gait instability with frequent falls. However the past week, his speech has been even worsening, and he has had a mechanical fall near daily due to gait instability. Denies any confusion, vision changes, focal numbness. States that he was seen by his neurologist today who turned off the left side of his DBS, and sent him to the ED for evaluation of acute stroke. States that he has had gradual worsening of left lower extremity weakness that has been ongoing since this past week but has had residual weakness due to old ankle injury for a long time.  Past Medical History:  Diagnosis Date  . Arthritis   . BPH (benign prostatic hyperplasia)    takes Finasteride and Flomax daily  . Depression    takes Zoloft daily  . Diabetes ([place])    takes Toujeo,Jardiance,Amaryl,and Tradjenta daily  . Eczema    uses a cream  . History of colon polyps    benign  . History of kidney stones   . Hyperlipemia    takes Atorvastatin daily as well as Lopid  . Hypertension    takes Amlodipine,Labetalol daily as well as Losartan-HCTZ  . Low back pain    sciatica  . Nocturia   . Peripheral neuropathy   . Peripheral neuropathy    takes Gabapentin daily  . Psoriasis   . Sleep apnea   . Tremor   . Urinary urgency     Patient Active Problem List   Diagnosis Date Noted  . Essential tremor 08/07/2015  . Tremor 07/31/2015     Past Surgical History:  Procedure Laterality Date  . ANKLE ARTHROSCOPY Left    x 2  . ANKLE FUSION Left   . CARPAL TUNNEL RELEASE N/A 07/24/2015   Procedure: Fiducial placement for deep brain stimulator;  Surgeon: Erline Levine, MD;  Location: [facility];  Service: Neurosurgery;  Laterality: N/A;  Fiducial placement for deep brain stimulator  . CIRCUMCISION    . COLONOSCOPY    . EYE SURGERY Bilateral    blepharoplasty  . PULSE GENERATOR IMPLANT Left 08/07/2015   Procedure: UNILATERAL PULSE GENERATOR IMPLANT (LEFT);  Surgeon: Erline Levine, MD;  Location: [facility];  Service: Neurosurgery;  Laterality: Left;  UNILATERAL PULSE GENERATOR IMPLANT (LEFT)  . PULSE GENERATOR IMPLANT Right 07/01/2016   Procedure: RIGHT IMPLANTABLE PULSE GENERATOR PLACEMENT;  Surgeon: Erline Levine, MD;  Location: [facility];  Service: Neurosurgery;  Laterality: Right;  RIGHT IMPLANTABLE PULSE GENERATOR PLACEMENT  . SUBTHALAMIC STIMULATOR INSERTION Left 07/31/2015   Procedure: LEFT DEEP BRAIN STIMULATOR INSERTION;  Surgeon: Erline Levine, MD;  Location: [facility];  Service: Neurosurgery;  Laterality: Left;  LEFT DEEP BRAIN STIMULATOR INSERTION  . SUBTHALAMIC STIMULATOR INSERTION Right 06/24/2016   Procedure: RIGHT DEEP BRAIN STIMULATOR PLACEMENT;  Surgeon: Erline Levine, MD;  Location: [facility];  Service: Neurosurgery;  Laterality: Right;  RIGHT DEEP BRAIN STIMULATOR PLACEMENT  .  Thumb surgery Left    ligament       Home Medications    Prior to Admission medications   Medication Sig Start Date End Date Taking? Authorizing Provider  amLODipine (NORVASC) 10 MG tablet Take 10 mg by mouth daily.   Yes [provider]  atorvastatin (LIPITOR) 40 MG tablet Take 40 mg by mouth at bedtime.    Yes [provider]  beta carotene w/minerals (OCUVITE) tablet Take 1 tablet by mouth daily.   Yes [provider]  clotrimazole (LOTRIMIN) 1 % cream Apply 1 application topically 2 (two) times daily.   Yes  [provider]  empagliflozin (JARDIANCE) 25 MG TABS tablet Take 25 mg by mouth daily.   Yes [provider]  finasteride (PROSCAR) 5 MG tablet Take 5 mg by mouth at bedtime.  07/01/15  Yes [provider]  gabapentin (NEURONTIN) 600 MG tablet Take 600 mg by mouth 2 (two) times daily.   Yes [provider]  gemfibrozil (LOPID) 600 MG tablet Take 600 mg by mouth 2 (two) times daily before a meal.   Yes [provider]  glimepiride (AMARYL) 4 MG tablet Take 4 mg by mouth daily with breakfast.   Yes [provider]  labetalol (NORMODYNE) 100 MG tablet Take 100 mg by mouth 2 (two) times daily. 05/09/16  Yes [provider]  linagliptin (TRADJENTA) 5 MG TABS tablet Take 5 mg by mouth daily.   Yes [provider]  losartan-hydrochlorothiazide (HYZAAR) 100-25 MG per tablet Take 1 tablet by mouth daily.   Yes [provider]  mirabegron ER (MYRBETRIQ) 50 MG TB24 tablet Take 50 mg by mouth daily. 11/01/16  Yes [provider]  pimecrolimus (ELIDEL) 1 % cream Apply 1 application topically daily as needed (for psoriasis).   Yes [provider]  Polyvinyl Alcohol-Povidone (REFRESH OP) Apply 1 drop to eye daily as needed (dry eyes).   Yes [provider]  sertraline (ZOLOFT) 100 MG tablet Take 100 mg by mouth daily.   Yes [provider]  tamsulosin (FLOMAX) 0.4 MG CAPS capsule Take 0.4 mg by mouth at bedtime.    Yes [provider]  TOUJEO SOLOSTAR 300 UNIT/ML SOPN Inject 56 Units into the skin at bedtime.  07/10/15  Yes [provider]    Family History No family history on file.  Social History Social History  Substance Use Topics  . Smoking status: Former Smoker    Years: 24.00  . Smokeless tobacco: Never Used     Comment: quit smoking in 1984  . Alcohol use No     Allergies   No known allergies   Review of Systems Review of Systems  Constitutional: Negative  for fever.  Respiratory: Positive for cough. Negative for shortness of breath.   Cardiovascular: Negative for chest pain.  Gastrointestinal: Negative for abdominal pain.  Genitourinary: Positive for frequency.  Neurological: Positive for speech difficulty and weakness.  Psychiatric/Behavioral: Negative for confusion.  All other systems reviewed and are negative.    Physical Exam Updated Vital Signs BP (!) 141/65   Pulse 63   Temp 98.1 F (36.7 C)   Resp 18   Ht 6\' 1"  (1.854 m)   Wt 119.3 kg (263 lb)   SpO2 94%   BMI 34.70 kg/m   Physical Exam Physical Exam  Nursing note and vitals reviewed. Constitutional: Well developed, well nourished, non-toxic, and in no acute distress Head: Normocephalic and atraumatic.  Mouth/Throat: Oropharynx is clear and  moist.  Neck: Normal range of motion. Neck supple.  Cardiovascular: Normal rate and regular rhythm.   Pulmonary/Chest: Effort normal and breath sounds normal.  Abdominal: Soft. There is no tenderness. There is no rebound and no guarding.  Musculoskeletal: Normal range of motion.  Skin: Skin is warm and dry.  Psychiatric: Cooperative Neurological:  Alert, oriented to person, place, time, and situation. Memory grossly in tact. Fluent speech. No dysarthria. Some expressive aphasia.  Cranial nerves: VF are full. EOMI without nystagmus. No gaze deviation. Facial muscles symmetric with activation. Sensation to light touch over face in tact bilaterally. Hearing grossly in tact. Palate elevates symmetrically. Head turn and shoulder shrug are intact. Tongue midline.  Reflexes defered.  Muscle bulk and tone normal. Subtle weakness of LLE against gravity. Full strength against gravity of all other extremities Sensation to light touch is in tact throughout in bilateral upper and lower extremities. Coordination reveals no dysmetria with finger to nose on the right. Left side with essential tremor and difficult to assess. Gait is narrow-based  and steady. Non-ataxic.    ED Treatments / Results  Labs (all labs ordered are listed, but only abnormal results are displayed) Labs Reviewed  COMPREHENSIVE METABOLIC PANEL - Abnormal; Notable for the following:       Result Value   CO2 21 (*)    Glucose, Bld 177 (*)    BUN 25 (*)    Creatinine, Ser 1.60 (*)    GFR calc non Af Amer 40 (*)    GFR calc Af Amer 47 (*)    All other components within normal limits  URINALYSIS, ROUTINE W REFLEX MICROSCOPIC - Abnormal; Notable for the following:    Glucose, UA >=500 (*)    Protein, ur 100 (*)    Leukocytes, UA TRACE (*)    Bacteria, UA RARE (*)    Squamous Epithelial / LPF 0-5 (*)    All other components within normal limits  PROTIME-INR  APTT  CBC  DIFFERENTIAL  I-STAT TROPOININ, ED  CBG MONITORING, ED  I-STAT CHEM 8, ED    EKG  EKG Interpretation  Date/Time:  Wednesday December 14 2016 15:54:14 EDT Ventricular Rate:  90 PR Interval:  154 QRS Duration: 92 QT Interval:  398 QTC Calculation: 486 R Axis:   31 Text Interpretation:  Normal sinus rhythm Nonspecific ST and T wave abnormality Prolonged QT Abnormal ECG no acute changes other than prolonged QT Confirmed by Brantley Stage [phone]) on 12/14/2016 8:21:27 PM       Radiology Dg Chest 2 View  Result Date: 12/14/2016 CLINICAL DATA:  76 year old male with frequent falls and worsening ectasia. Cough and congestion. EXAM: CHEST  2 VIEW COMPARISON:  None. FINDINGS: There are minimal bibasilar atelectatic changes. Developing infiltrate is less likely but not excluded. Clinical correlation is recommended. There is no focal consolidation, pleural effusion, or pneumothorax. There is a 6 mm nodule in the right lung base which may represent a granuloma. CT may provide better evaluation. There is mild cardiomegaly. Mild central vascular prominence may represent a degree of congestion. No interstitial edema. There is atherosclerotic calcification of the aortic arch. Brain stimulator device is  noted over the upper chest. No acute osseous pathology. IMPRESSION: 1. Bibasilar atelectasis.  Infiltrate is less likely. 2. Cardiomegaly with probable mild vascular congestion. 3. A 6 mm right lung base pulmonary nodule. Electronically Signed   By: Anner Crete M.D.   On: 12/14/2016 21:41   Ct Head Wo Contrast  Result Date: 12/14/2016 CLINICAL  DATA:  Worsening falls and slurred speech.  Lethargic. EXAM: CT HEAD WITHOUT CONTRAST TECHNIQUE: Contiguous axial images were obtained from the base of the skull through the vertex without intravenous contrast. COMPARISON:  09/08/2016 FINDINGS: Brain: No evidence of acute infarction, hemorrhage, hydrocephalus, extra-axial collection or mass lesion/mass effect. Unchanged positioning of the deep brain stimulator leads, terminating in the region of bilateral subthalamic nuclei. Moderate brain parenchymal volume loss and periventricular microangiopathy. Vascular: Vascular calcifications at the skullbase. Skull: Normal. Negative for fracture or focal lesion. Sinuses/Orbits: No acute finding. Other: None. IMPRESSION: No acute intracranial abnormality. Moderate brain parenchymal atrophy and chronic microvascular disease. Stable positioning of deep brain stimulator leads. Electronically Signed   By: Fidela Salisbury M.D.   On: 12/14/2016 17:21    Procedures Procedures (including critical care time)  Medications Ordered in ED Medications - No data to display   Initial Impression / Assessment and Plan / ED Course  I have reviewed the triage vital signs and the nursing notes.  Pertinent labs & imaging results that were available during my care of the patient were reviewed by me and considered in my medical decision making (see chart for details).     76 year old male with history of essential tremor with bilateral DBS who presents with worsening speech difficulties and difficulty with ambulation over past several weeks. With mild weakness of the LLE and word  finding difficulty. Given progressively worsening nature of this over past several weeks, felt less likely to be related to stroke. Discussed with radiology. DBS would prevent DWI and T2 imaging that would evaluate for acute stroke. Also consult Dr. Leonel Ramsay, who felt stroke less likely but requested CT cervical and thoracic spine to rule out spinal stenosis. Patient and family requested to obtain this as outpatient and have ongoing work-up as outpatient. They will arrange outpatient CTs through Dr. Carles Collet.  I have consulted CM and placed home health orders, as family is interested in PT at home and SW for potential placement into rehab from home. Strict return and follow-up instructions reviewed. Patient and family expressed understanding of all discharge instructions and felt comfortable with the plan of care.   Final Clinical Impressions(s) / ED Diagnoses   Final diagnoses:  Gait instability  Essential tremor    New Prescriptions New Prescriptions   No medications on file     Forde Dandy, MD 12/14/16 2329

## 2016-12-14 NOTE — Progress Notes (Signed)
Patient came in to turn off DBS battery. I turned off battery on right side of chest/left side of body. Patient went to walk out of the office and could not move legs, kept leaning backwards. I called Dr. Carles Collet in to check on patient.

## 2016-12-14 NOTE — Progress Notes (Signed)
Subjective:   Logan Griffith was seen in consultation in the movement disorder clinic at the request of Center, [facility].  The evaluation is for tremor.  The patient is seen in consultation at the request of Dr. Jannifer Franklin.  I have reviewed her notes and appreciate those.  This patient is accompanied in the office by his spouse and child who supplement the history.  The patient has had essential tremor for approximately 5 years according to records.   It has been worse over the last 2 years.  It is in both hands.  He notices it most in the R hand but he is R hand dominant.  He has trouble writing checks and eating sounds.  The patient is on primidone, 250 mg daily.  He has never been on a higher dosage than this but he indicates that it is not helpful.  The patient states he is on labetalol, 300 mg twice a day.  He was told that he could take a higher dosage of this to see if that helped his tremor, but he has not done that.  Pt states that the labetolol is for BP, however.   He has noticed no benefit with the primidone.  Unfortunately, these medications are not helping.  He does have a history of kidney stones, and therefore is not able to have Topamax.  There is no family hx of tremor.  He comes in today looking for an opinion regarding DBS therapy.   Affected by caffeine:  No. (1-2 cups coffee/day - 18 oz each; 1-2 diet soda's a day) Affected by alcohol:  No. Affected by stress:  No. Affected by fatigue:  No. Spills soup if on spoon:  Yes.   Spills glass of liquid if full:  Yes.   Affects ADL's (tying shoes, brushing teeth, etc):  Yes.   (has to shave with 2 hands with blade)  Current/Previously tried tremor medications: primidone; beta blocker for BP  Outside reports reviewed: historical medical records and office notes.  04/07/15 update:  The patient returns today, accompanied by his wife and son who supplement the history.  The patient had neuropsych testing done by Dr. Richrd Sox on  03/18/2015.  She felt that he would be a good DBS candidate.  No evidence of dementia.  The patient remains on primidone, 250 mg twice a day for his tremor.  He is also on labetalol 300 mg twice a day but that is primarily for his blood pressure.  He has a history of nephrolithiasis and cannot take Topamax.  He remains interested in DBS therapy.  06/18/15 update: The patient follows up with me today, and final preparation for his DBS surgery.  It is scheduled for 07/30/2014.  He remains on primidone, 250 mg but is down to once a day.  He has seen Dr. Vertell Limber and reviewed logistics of surgery with him.  He expresses desire to proceed with DBS therapy.  09/04/15 update:  The patient is following up today, accompanied by his wife, son, daughter in law who supplement the history.  The patient is status post DBS surgery to the left VIM on 07/31/2015.  He had his generator placed on 08/07/2015.  He did have difficulty voiding after the surgery and did have to leave the hospital with a Foley in place.  He has had some nausea following surgery.    09/11/15 update:  The patient is following up today, accompanied by his wife who supplements the history.  The patient reports that  he has been doing well this week.  He is happy to report that he has written checks from the checkbook for the first time in a long time.  He also states that he has been shave with one hand.  He asks me about potentially having DBS for the left hand.  He continues to have some nausea.  I have reviewed records from his primary care provider from [place].  They started him on ranitidine.  He is not sure if it is helping.  He has episodes of emesis up to 3 times per week.  This seemed to start after surgery.  11/19/15 update:  The patient is following up today for his ET.  The patient is status post DBS surgery to the left VIM on 07/31/2015.  He had his generator placed on 08/07/2015.  He had places froze on his arms and legs.  Nausea is much better  than previous but "it has been a long while."  He thinks that is related to eating too much.  Overall, very happy with DBS.  No significant tremor on the right hand.  He saw Dr. Vertell Limber yesterday and pt mentioned that he is thinking about doing DBS on the other side.    03/23/16 update:  Pt f/u for ET.  He is doing well with regards to tremor in the right hand.  He wants to do DBS on the other side.  Asks me if we can get it done after the first of the year.  He eats in the living room and holds the glass in one hand and has trouble holding food with the other hand.   In regards to nausea he states that he has been doing well.  He had a few falls trying to tie his shoes and fell out of the chair (just slipped off the cushion).  He is in PT now.  07/21/16 update:  Patient follows up today, accompanied by his wife and son who supplement the history.  The patient underwent right VIM DBS on 06/24/2016.  He had his IPG placed on 07/01/2016.  Postoperative MRI showed expected lead placement.  Patient reports that he has recovered well.  He did fall 3 times in one day.  He fell over a chair.  He then was at a restaurant and spilled his drink and slipped on the drink.  He also fell outside that day.  Been to the doctor several times as he hurt his shoulder.    08/05/16 update:  Patient follows up today, accompanied by his son who supplements the history.  Patient's device was activated somewhat 2 weeks ago.  He continues to have some tremor in the left hand.  He has had no falls since our last visit.  He has not had any nausea.  He does tell me after our last visit he had a passing out episode.  Was getting out of the car and passed out for few seconds.  No warning.  No loss bladder/bowel control.  No confusion after.  No palpitations.  Didn't check BP or BS after the event.  He had not eaten at all and it was past lunch time (he is diabetic).    09/12/16 update: Patient follows up today, accompanied by his wife who  supplements the history.  Patient's wife had told us last week (09/05/2016) that he was having a significant number of falls since changes were made to his DBS.  He worked hard on trying to reprogram the device  at that he had efficacy without falls.  His wife feels that he has had personality change and speech change.  Because of that, a CT of the brain was done and it was unremarkable.  He has not had any falls since the reprogramming a week ago but has had near falls.    10/17/16 update:  Patient seen today, accompanied by his wife who supplements the history.  Last visit, I significantly dropped down the frequency and increased the amplitude of the DBS settings on the right brain, hoping that it would help with falls.  We also sent him to physical therapy.  It doesn't appear that he attended that and he last went to that in October.  However, he has not had further falls.  Wife does think that getting him suspenders for pants helped because has 2 hands to walk for balance now and not holding pants up with one hand.   No further syncopal episodes.  Tremor has been good.  Speech continues to be an issue for wife and others trying to understand him on the phone.  12/14/16 update:  Seen as a walk in.  Accompanied by a son and wife who supplement the history.  Slowly getting worse but much worse this week both in speech and walking.  Went from 1 fall a month to 1 fall a week.  Could walk in my office but then can't get out of the office (after device turned off on the right by medical assistant).    Allergies  Allergen Reactions  . No Known Allergies     Outpatient Encounter Prescriptions as of 12/14/2016  Medication Sig  . amLODipine (NORVASC) 10 MG tablet Take 10 mg by mouth daily.  Marland Kitchen atorvastatin (LIPITOR) 40 MG tablet Take 40 mg by mouth daily.  . beta carotene w/minerals (OCUVITE) tablet Take 1 tablet by mouth daily.  . clotrimazole (LOTRIMIN) 1 % cream Apply 1 application topically 2 (two) times  daily.  . empagliflozin (JARDIANCE) 25 MG TABS tablet Take 25 mg by mouth daily.  . finasteride (PROSCAR) 5 MG tablet Take 5 mg by mouth daily.   Marland Kitchen gabapentin (NEURONTIN) 600 MG tablet Take 600 mg by mouth 2 (two) times daily.  Marland Kitchen gemfibrozil (LOPID) 600 MG tablet Take 600 mg by mouth 2 (two) times daily before a meal.  . glimepiride (AMARYL) 4 MG tablet Take 4 mg by mouth daily with breakfast.  . labetalol (NORMODYNE) 100 MG tablet Take 100 mg by mouth 2 (two) times daily.  Marland Kitchen linagliptin (TRADJENTA) 5 MG TABS tablet Take 5 mg by mouth daily.  Marland Kitchen losartan-hydrochlorothiazide (HYZAAR) 100-25 MG per tablet Take 1 tablet by mouth daily.  . pimecrolimus (ELIDEL) 1 % cream Apply 1 application topically daily as needed (for psoriasis).  . Polyvinyl Alcohol-Povidone (REFRESH OP) Apply 1 drop to eye daily as needed (dry eyes).  . sertraline (ZOLOFT) 100 MG tablet Take 100 mg by mouth daily.  . tamsulosin (FLOMAX) 0.4 MG CAPS capsule Take 0.4 mg by mouth daily.   Nelva Nay SOLOSTAR 300 UNIT/ML SOPN Inject 56 Units into the skin at bedtime.   . vitamin B-12 (CYANOCOBALAMIN) 1000 MCG tablet Take 1,000 mcg by mouth daily.   No facility-administered encounter medications on file as of 12/14/2016.     Past Medical History:  Diagnosis Date  . Arthritis   . BPH (benign prostatic hyperplasia)    takes Finasteride and Flomax daily  . Depression    takes Zoloft daily  . Diabetes (  [place])    takes Toujeo,Jardiance,Amaryl,and Tradjenta daily  . Eczema    uses a cream  . History of colon polyps    benign  . History of kidney stones   . Hyperlipemia    takes Atorvastatin daily as well as Lopid  . Hypertension    takes Amlodipine,Labetalol daily as well as Losartan-HCTZ  . Low back pain    sciatica  . Nocturia   . Peripheral neuropathy   . Peripheral neuropathy    takes Gabapentin daily  . Psoriasis   . Sleep apnea   . Tremor   . Urinary urgency     Past Surgical History:  Procedure Laterality  Date  . ANKLE ARTHROSCOPY Left    x 2  . ANKLE FUSION Left   . CARPAL TUNNEL RELEASE N/A 07/24/2015   Procedure: Fiducial placement for deep brain stimulator;  Surgeon: Erline Levine, MD;  Location: [place] [facility];  Service: Neurosurgery;  Laterality: N/A;  Fiducial placement for deep brain stimulator  . CIRCUMCISION    . COLONOSCOPY    . EYE SURGERY Bilateral    blepharoplasty  . PULSE GENERATOR IMPLANT Left 08/07/2015   Procedure: UNILATERAL PULSE GENERATOR IMPLANT (LEFT);  Surgeon: Erline Levine, MD;  Location: [place] [facility];  Service: Neurosurgery;  Laterality: Left;  UNILATERAL PULSE GENERATOR IMPLANT (LEFT)  . PULSE GENERATOR IMPLANT Right 07/01/2016   Procedure: RIGHT IMPLANTABLE PULSE GENERATOR PLACEMENT;  Surgeon: Erline Levine, MD;  Location: [place];  Service: Neurosurgery;  Laterality: Right;  RIGHT IMPLANTABLE PULSE GENERATOR PLACEMENT  . SUBTHALAMIC STIMULATOR INSERTION Left 07/31/2015   Procedure: LEFT DEEP BRAIN STIMULATOR INSERTION;  Surgeon: Erline Levine, MD;  Location: [place] [facility];  Service: Neurosurgery;  Laterality: Left;  LEFT DEEP BRAIN STIMULATOR INSERTION  . SUBTHALAMIC STIMULATOR INSERTION Right 06/24/2016   Procedure: RIGHT DEEP BRAIN STIMULATOR PLACEMENT;  Surgeon: Erline Levine, MD;  Location: [place];  Service: Neurosurgery;  Laterality: Right;  RIGHT DEEP BRAIN STIMULATOR PLACEMENT  . Thumb surgery Left    ligament    Social History   Social History  . Marital status: Married    Spouse name: N/A  . Number of children: N/A  . Years of education: N/A   Occupational History  . Not on file.   Social History Main Topics  . Smoking status: Former Smoker    Years: 24.00  . Smokeless tobacco: Never Used     Comment: quit smoking in 1984  . Alcohol use No  . Drug use: No  . Sexual activity: Not on file   Other Topics Concern  . Not on file   Social History Narrative  . No narrative on file    Family Status  Relation Status  . Mother Deceased       HTN  .  Father Deceased       HTN  . Brother Alive    Review of Systems  A complete 10 system ROS was obtained and was negative apart from what is mentioned.   Objective:   VITALS:   Vitals:   12/14/16 1501  Temp: 98.1 F (36.7 C)   Gen:  Appears stated age and in NAD. HEENT:  Normocephalic, atraumatic. The mucous membranes are moist. The superficial temporal arteries are without ropiness or tenderness. Cardiovascular: Regular rate and rhythm. Lungs: Clear to auscultation bilaterally. Neck: There are no carotid bruits noted bilaterally.  NEUROLOGICAL:  Orientation:  The patient is alert and oriented x 3.   Cranial nerves: There is good facial  symmetry.  Speech is fluent but dysarthric. Soft palate rises symmetrically and there is no tongue deviation. Hearing is intact to conversational tone. Tone: Tone is good throughout. Sensation: Sensation is intact to light touch throughout Coordination:  The patient has no dysdiadichokinesia or dysmetria. Motor: Strength is 5/5 in the bilateral UE and RLE.  Strength in proximal LLE is only 3+-4-/5 and more distal it is 5-/5   MOVEMENT EXAM: Tremor:  No tremor in either hand with device on.    DBS device initially off on the right (MA had turned it off before I came in).  I turned it back on and gave patient a walker.  He required some assistance out of the chair and then the L leg was dragging.  I then let him sit, turned on the device and waited 5 min.  I walked him again with the walker and he walked about the same with the walker.  I turned the right side back off     Assessment/Plan:   1.  Essential Tremor.  -The patient is status post DBS surgery to the left VIM on 07/31/2015.  He had his generator placed on 08/07/2015.  -The patient underwent right VIM DBS on 06/24/2016.  He had his IPG placed on 07/01/2016.This lead may be slightly anterior.  He has had less efficacy and more side effect issues with this side.  He has had significant  number of falls.  By turning down frequency we had eliminated falls and now have tremor control but he is still having some speech issues. Things got acutely worse and L leg weak and I am concerned about stroke.  I am going to send to the ER.  DBS rep advised in case patient needs MRI.  Pts family wants to drive him.  Was given a wheelchair to exit office 2.  Diabetic peripheral neuropathy  -contributes to loss of balance.  10/2016 UA shows lots of glucose in urine 3.  Nausea, following surgery  -He has had nausea following first surgery in 2017.  This resolved 4.  Much greater than 50% of this visit was spent in counseling and coordinating care.  Total face to face time:  25 min.  Did not include DBS time

## 2016-12-15 ENCOUNTER — Telehealth: Payer: Self-pay | Admitting: *Deleted

## 2016-12-15 ENCOUNTER — Telehealth: Payer: Self-pay | Admitting: Neurology

## 2016-12-15 DIAGNOSIS — F809 Developmental disorder of speech and language, unspecified: Secondary | ICD-10-CM

## 2016-12-15 DIAGNOSIS — R269 Unspecified abnormalities of gait and mobility: Secondary | ICD-10-CM

## 2016-12-15 DIAGNOSIS — R29898 Other symptoms and signs involving the musculoskeletal system: Secondary | ICD-10-CM

## 2016-12-15 DIAGNOSIS — R479 Unspecified speech disturbances: Principal | ICD-10-CM

## 2016-12-15 NOTE — Telephone Encounter (Signed)
Patient returning your call.  Thanks.

## 2016-12-15 NOTE — Telephone Encounter (Signed)
Patient scheduled for MR's at [facility] on 12/22/16 at 2:00 pm.   Left message on machine for patient to call back.

## 2016-12-15 NOTE — Telephone Encounter (Signed)
Tried to call him back with no answer. Calling to make him aware we need outpatient MR's. Tried to schedule at [facility] but lumbar MR not compatible with DBS. I will call and let him know where he can have this done.

## 2016-12-15 NOTE — Telephone Encounter (Signed)
Anniemae Haberkorn J. Clydene Laming, RN, BSN, [employer] [phone] Spoke with pt and spouse via telephone at bedside regarding discharge planning for [facility]. Offered pt list of home health agencies to choose from.  Pt chose [facility] to render services. Danny Lawless of [facility] notified. Patient made aware that [facility] will be in contact in 24-48 hours.  No DME needs identified at this time.

## 2016-12-15 NOTE — Telephone Encounter (Signed)
Order entered. Message to [employer] Rep about scheduling. Awaiting call back.

## 2016-12-15 NOTE — Telephone Encounter (Signed)
Logan Griffith, lets schedule an out patient MRI brain and lumbar spine.  Dusty will need to be present since has the device implanted.  Let pt know

## 2016-12-19 ENCOUNTER — Encounter: Payer: Self-pay | Admitting: Neurology

## 2016-12-19 NOTE — Telephone Encounter (Signed)
Spoke with patient's wife and made her aware MR's would need done at [facility].  She states her son was nervous about the MR's with his DBS but I made them aware it needs done at [facility] due to the equipment needed.  She will discuss with patient and son and let me know if they will proceed with MR.  She states balance is the same. Memory is not good. He is about stable from last week.

## 2016-12-21 ENCOUNTER — Telehealth: Payer: Self-pay | Admitting: Neurology

## 2016-12-21 NOTE — Telephone Encounter (Signed)
Hope called and was told by patient's wife to call you to schedule the MRI. So she was calling you to do that. She said then you would relay to Mrs. Harlow Mares. Also, April Williams (PT) called needing to get verbal orders to continue PT. Her # is [phone]. Thanks

## 2016-12-22 ENCOUNTER — Ambulatory Visit (HOSPITAL_COMMUNITY): Payer: Medicare Other

## 2016-12-22 ENCOUNTER — Telehealth: Payer: Self-pay | Admitting: Neurology

## 2016-12-22 NOTE — Telephone Encounter (Signed)
MR scheduled for 12/30/16 at 11 am. MR department on [facility]. They can park at the facility with code: [phone].  [employer] Rep will be present.

## 2016-12-22 NOTE — Telephone Encounter (Signed)
Called and gave verbal order to April with [facility].

## 2016-12-22 NOTE — Telephone Encounter (Signed)
Spoke with patient's daughter. Made aware of information for MR.   She states patient had BP 214/140 last night.  He had mistakenly taken blood pressure medication wrong at two tablets in the morning instead of one in the morning, one in the evening.  I told them to keep track of it. If he didn't take meds in the evening like he normally does that would explain the increase but he may want to talk to PCP and definitely to call PCP if consistent.

## 2016-12-22 NOTE — Telephone Encounter (Signed)
°  Logan Griffith is calling for PT orders for the patients, she also requests ST and OT  Her number is [phone].

## 2016-12-29 ENCOUNTER — Telehealth: Payer: Self-pay | Admitting: Neurology

## 2016-12-29 NOTE — Telephone Encounter (Signed)
Pt wife would like to know if he should keep the Sept. 20 appt.  Pt has been in the hospital and they have just gotten him back home in a facility.  Please advise.

## 2016-12-29 NOTE — Telephone Encounter (Signed)
Left message on machine for patient to call back.

## 2016-12-30 ENCOUNTER — Encounter: Payer: Self-pay | Admitting: Neurology

## 2016-12-30 NOTE — Telephone Encounter (Signed)
Left another message for wife to call me back.

## 2017-01-03 NOTE — Telephone Encounter (Signed)
Patient's wife called back.   Notes from [facility] in [place].   Patient currently in SNF for rehab and doing better. Still having trouble walking but speech is better.   He never had MR lumbar spine. He went to [facility] ER and they did perform MR brain.  I encouraged wife to r/s MR lumbar spine to further assess symptoms.   She wants to know if Dr. Carles Collet would want to see patient sooner than September 20.   Dr. Carles Collet please advise.

## 2017-01-05 NOTE — Telephone Encounter (Signed)
We may be able to find a work in spot before then (help me see if there is a prolonged one) but looks like he had a SDH and I agree with recommendations from [facility] and not sure that I have a ton more to add.  Happy to see him however if they would like.

## 2017-01-06 NOTE — Telephone Encounter (Signed)
LMOM making patient's wife aware of this information and for her to call me if she wants me to move appt sooner.

## 2017-01-22 MED ORDER — IOPAMIDOL (ISOVUE-370) INJECTION 76%
INTRAVENOUS | Status: AC
  Filled 2017-01-22: qty 100

## 2017-01-26 NOTE — Addendum Note (Signed)
Addendum  created 01/26/17 1021 by Roberts Gaudy, MD   Sign clinical note

## 2017-02-23 ENCOUNTER — Encounter: Payer: Self-pay | Admitting: Neurology

## 2017-02-23 ENCOUNTER — Ambulatory Visit (INDEPENDENT_AMBULATORY_CARE_PROVIDER_SITE_OTHER): Payer: Medicare Other | Admitting: Neurology

## 2017-02-23 VITALS — BP 120/62 | HR 72 | Ht 73.0 in | Wt 247.0 lb

## 2017-02-23 DIAGNOSIS — G250 Essential tremor: Secondary | ICD-10-CM | POA: Diagnosis not present

## 2017-02-23 NOTE — Patient Instructions (Signed)
You can contact [employer] at [phone] to get a new remote sent to you.

## 2017-02-23 NOTE — Procedures (Signed)
DBS Programming was performed.    Total time spent programming was 10 min.  Device was confirmed to be on.  Soft start was on at 8.  Impedences were checked and were within normal limits.  Battery was checked and was determined to be functioning normally and not near the end of life (2.95).  Final settings were as follows:  Left brain electrode:     1-2+           ; Amplitude  2.9   V   ; Pulse width 90 microseconds;   Frequency   160   Hz.  Right brain electrode Group A:   0-C+     ; Amplitude: 4.3 V; pulse width 150 microsec; frequency 120 HZ  Right brain electrode Group B:   2-C+         ; Amplitude  4.0   V   ; Pulse width 100 microseconds;   Frequency   150   Hz.     Pt left on group A. But right brain electrode left OFF before patient left

## 2017-02-23 NOTE — Progress Notes (Signed)
Subjective:   Logan Griffith was seen in consultation in the movement disorder clinic at the request of [facility].  The evaluation is for tremor.  The patient is seen in consultation at the request of Dr. Jannifer Franklin.  I have reviewed her notes and appreciate those.  This patient is accompanied in the office by his spouse and child who supplement the history.  The patient has had essential tremor for approximately 5 years according to records.   It has been worse over the last 2 years.  It is in both hands.  He notices it most in the R hand but he is R hand dominant.  He has trouble writing checks and eating sounds.  The patient is on primidone, 250 mg daily.  He has never been on a higher dosage than this but he indicates that it is not helpful.  The patient states he is on labetalol, 300 mg twice a day.  He was told that he could take a higher dosage of this to see if that helped his tremor, but he has not done that.  Pt states that the labetolol is for BP, however.   He has noticed no benefit with the primidone.  Unfortunately, these medications are not helping.  He does have a history of kidney stones, and therefore is not able to have Topamax.  There is no family hx of tremor.  He comes in today looking for an opinion regarding DBS therapy.   Affected by caffeine:  No. (1-2 cups coffee/day - 18 oz each; 1-2 diet soda's a day) Affected by alcohol:  No. Affected by stress:  No. Affected by fatigue:  No. Spills soup if on spoon:  Yes.   Spills glass of liquid if full:  Yes.   Affects ADL's (tying shoes, brushing teeth, etc):  Yes.   (has to shave with 2 hands with blade)  Current/Previously tried tremor medications: primidone; beta blocker for BP  Outside reports reviewed: historical medical records and office notes.  04/07/15 update:  The patient returns today, accompanied by his wife and son who supplement the history.  The patient had neuropsych testing done by Dr. Richrd Sox on  03/18/2015.  She felt that he would be a good DBS candidate.  No evidence of dementia.  The patient remains on primidone, 250 mg twice a day for his tremor.  He is also on labetalol 300 mg twice a day but that is primarily for his blood pressure.  He has a history of nephrolithiasis and cannot take Topamax.  He remains interested in DBS therapy.  06/18/15 update: The patient follows up with me today, and final preparation for his DBS surgery.  It is scheduled for 07/30/2014.  He remains on primidone, 250 mg but is down to once a day.  He has seen Dr. Vertell Limber and reviewed logistics of surgery with him.  He expresses desire to proceed with DBS therapy.  09/04/15 update:  The patient is following up today, accompanied by his wife, son, daughter in law who supplement the history.  The patient is status post DBS surgery to the left VIM on 07/31/2015.  He had his generator placed on 08/07/2015.  He did have difficulty voiding after the surgery and did have to leave the hospital with a Foley in place.  He has had some nausea following surgery.    09/11/15 update:  The patient is following up today, accompanied by his wife who supplements the history.  The patient reports that  he has been doing well this week.  He is happy to report that he has written checks from the checkbook for the first time in a long time.  He also states that he has been shave with one hand.  He asks me about potentially having DBS for the left hand.  He continues to have some nausea.  I have reviewed records from his primary care provider from [place].  They started him on ranitidine.  He is not sure if it is helping.  He has episodes of emesis up to 3 times per week.  This seemed to start after surgery.  11/19/15 update:  The patient is following up today for his ET.  The patient is status post DBS surgery to the left VIM on 07/31/2015.  He had his generator placed on 08/07/2015.  He had places froze on his arms and legs.  Nausea is much better  than previous but "it has been a long while."  He thinks that is related to eating too much.  Overall, very happy with DBS.  No significant tremor on the right hand.  He saw Dr. Vertell Limber yesterday and pt mentioned that he is thinking about doing DBS on the other side.    03/23/16 update:  Pt f/u for ET.  He is doing well with regards to tremor in the right hand.  He wants to do DBS on the other side.  Asks me if we can get it done after the first of the year.  He eats in the living room and holds the glass in one hand and has trouble holding food with the other hand.   In regards to nausea he states that he has been doing well.  He had a few falls trying to tie his shoes and fell out of the chair (just slipped off the cushion).  He is in PT now.  07/21/16 update:  Patient follows up today, accompanied by his wife and son who supplement the history.  The patient underwent right VIM DBS on 06/24/2016.  He had his IPG placed on 07/01/2016.  Postoperative MRI showed expected lead placement.  Patient reports that he has recovered well.  He did fall 3 times in one day.  He fell over a chair.  He then was at a restaurant and spilled his drink and slipped on the drink.  He also fell outside that day.  Been to the doctor several times as he hurt his shoulder.    08/05/16 update:  Patient follows up today, accompanied by his son who supplements the history.  Patient's device was activated somewhat 2 weeks ago.  He continues to have some tremor in the left hand.  He has had no falls since our last visit.  He has not had any nausea.  He does tell me after our last visit he had a passing out episode.  Was getting out of the car and passed out for few seconds.  No warning.  No loss bladder/bowel control.  No confusion after.  No palpitations.  Didn't check BP or BS after the event.  He had not eaten at all and it was past lunch time (he is diabetic).    09/12/16 update: Patient follows up today, accompanied by his wife who  supplements the history.  Patient's wife had told us last week (09/05/2016) that he was having a significant number of falls since changes were made to his DBS.  He worked hard on trying to reprogram the device  at that he had efficacy without falls.  His wife feels that he has had personality change and speech change.  Because of that, a CT of the brain was done and it was unremarkable.  He has not had any falls since the reprogramming a week ago but has had near falls.    10/17/16 update:  Patient seen today, accompanied by his wife who supplements the history.  Last visit, I significantly dropped down the frequency and increased the amplitude of the DBS settings on the right brain, hoping that it would help with falls.  We also sent him to physical therapy.  It doesn't appear that he attended that and he last went to that in October.  However, he has not had further falls.  Wife does think that getting him suspenders for pants helped because has 2 hands to walk for balance now and not holding pants up with one hand.   No further syncopal episodes.  Tremor has been good.  Speech continues to be an issue for wife and others trying to understand him on the phone.  12/14/16 update:  Seen as a walk in.  Accompanied by a son and wife who supplement the history.  Slowly getting worse but much worse this week both in speech and walking.  Went from 1 fall a month to 1 fall a week.  Could walk in my office but then can't get out of the office (after device turned off on the right by medical assistant).    02/23/17 update:  Patient was seen in follow-up today for his essential tremor.  Much has happened since our last visit.  I have reviewed extensive numbers of records.  When I saw him last visit, he was having acute difficulties in walking.  I ended up sending him to the emergency room at [facility].  CT brain was done and reviewed by me.  It was nonacute.  [facility] felt that the issue was nonacute and he was sent home.  He ended  up presenting to [facility] on July 20 and was admitted for L frontal SDH, max of 23mm thick.  He was encephalopathic on admission and required precedex gtt due to BP issues.  He had LTM EEG due to decreased LOC and that was negative.  He went to SNF for rehab for about a month and he got stronger.  He has been home for about a month and [facility] is coming to the home.   The walker is with him all the time.  One fall since home and fell into the desk.    Allergies  Allergen Reactions  . No Known Allergies     Outpatient Encounter Prescriptions as of 02/23/2017  Medication Sig  . amLODipine (NORVASC) 10 MG tablet Take 10 mg by mouth daily.  Marland Kitchen atorvastatin (LIPITOR) 40 MG tablet Take 40 mg by mouth at bedtime.   . beta carotene w/minerals (OCUVITE) tablet Take 1 tablet by mouth daily.  . clotrimazole (LOTRIMIN) 1 % cream Apply 1 application topically 2 (two) times daily.  . empagliflozin (JARDIANCE) 25 MG TABS tablet Take 25 mg by mouth daily.  . finasteride (PROSCAR) 5 MG tablet Take 5 mg by mouth at bedtime.   . gabapentin (NEURONTIN) 600 MG tablet Take 600 mg by mouth 2 (two) times daily.  Marland Kitchen gemfibrozil (LOPID) 600 MG tablet Take 600 mg by mouth 2 (two) times daily before a meal.  . glimepiride (AMARYL) 4 MG tablet Take 4 mg by mouth daily  with breakfast.  . labetalol (NORMODYNE) 100 MG tablet Take 100 mg by mouth 2 (two) times daily.  Marland Kitchen linagliptin (TRADJENTA) 5 MG TABS tablet Take 5 mg by mouth daily.  Marland Kitchen losartan-hydrochlorothiazide (HYZAAR) 100-25 MG per tablet Take 1 tablet by mouth daily.  . mirabegron ER (MYRBETRIQ) 50 MG TB24 tablet Take 50 mg by mouth daily.  . pimecrolimus (ELIDEL) 1 % cream Apply 1 application topically daily as needed (for psoriasis).  . Polyvinyl Alcohol-Povidone (REFRESH OP) Apply 1 drop to eye daily as needed (dry eyes).  . sertraline (ZOLOFT) 100 MG tablet Take 100 mg by mouth daily.  . tamsulosin (FLOMAX) 0.4 MG CAPS capsule Take 0.4 mg by mouth at bedtime.   Nelva Nay SOLOSTAR 300 UNIT/ML SOPN Inject 56 Units into the skin at bedtime.    No facility-administered encounter medications on file as of 02/23/2017.     Past Medical History:  Diagnosis Date  . Arthritis   . BPH (benign prostatic hyperplasia)    takes Finasteride and Flomax daily  . Depression    takes Zoloft daily  . Diabetes ([place])    takes Toujeo,Jardiance,Amaryl,and Tradjenta daily  . Eczema    uses a cream  . History of colon polyps    benign  . History of kidney stones   . Hyperlipemia    takes Atorvastatin daily as well as Lopid  . Hypertension    takes Amlodipine,Labetalol daily as well as Losartan-HCTZ  . Low back pain    sciatica  . Nocturia   . Peripheral neuropathy   . Peripheral neuropathy    takes Gabapentin daily  . Psoriasis   . Sleep apnea   . Tremor   . Urinary urgency     Past Surgical History:  Procedure Laterality Date  . ANKLE ARTHROSCOPY Left    x 2  . ANKLE FUSION Left   . CARPAL TUNNEL RELEASE N/A 07/24/2015   Procedure: Fiducial placement for deep brain stimulator;  Surgeon: Erline Levine, MD;  Location: [place] [facility];  Service: Neurosurgery;  Laterality: N/A;  Fiducial placement for deep brain stimulator  . CIRCUMCISION    . COLONOSCOPY    . EYE SURGERY Bilateral    blepharoplasty  . PULSE GENERATOR IMPLANT Left 08/07/2015   Procedure: UNILATERAL PULSE GENERATOR IMPLANT (LEFT);  Surgeon: Erline Levine, MD;  Location: [place] [facility];  Service: Neurosurgery;  Laterality: Left;  UNILATERAL PULSE GENERATOR IMPLANT (LEFT)  . PULSE GENERATOR IMPLANT Right 07/01/2016   Procedure: RIGHT IMPLANTABLE PULSE GENERATOR PLACEMENT;  Surgeon: Erline Levine, MD;  Location: [facility];  Service: Neurosurgery;  Laterality: Right;  RIGHT IMPLANTABLE PULSE GENERATOR PLACEMENT  . SUBTHALAMIC STIMULATOR INSERTION Left 07/31/2015   Procedure: LEFT DEEP BRAIN STIMULATOR INSERTION;  Surgeon: Erline Levine, MD;  Location: [place] [facility];  Service: Neurosurgery;  Laterality: Left;   LEFT DEEP BRAIN STIMULATOR INSERTION  . SUBTHALAMIC STIMULATOR INSERTION Right 06/24/2016   Procedure: RIGHT DEEP BRAIN STIMULATOR PLACEMENT;  Surgeon: Erline Levine, MD;  Location: [place];  Service: Neurosurgery;  Laterality: Right;  RIGHT DEEP BRAIN STIMULATOR PLACEMENT  . Thumb surgery Left    ligament    Social History   Social History  . Marital status: Married    Spouse name: N/A  . Number of children: N/A  . Years of education: N/A   Occupational History  . Not on file.   Social History Main Topics  . Smoking status: Former Smoker    Years: 24.00  . Smokeless tobacco: Never Used  Comment: quit smoking in 1984  . Alcohol use No  . Drug use: No  . Sexual activity: Not on file   Other Topics Concern  . Not on file   Social History Narrative  . No narrative on file    Family Status  Relation Status  . Mother Deceased       HTN  . Father Deceased       HTN  . Brother Alive    Review of Systems  A complete 10 system ROS was obtained and was negative apart from what is mentioned.   Objective:   VITALS:   Vitals:   02/23/17 1505  BP: 120/62  Pulse: 72  SpO2: 90%  Weight: 247 lb (112 kg)  Height: 6\' 1"  (1.854 m)   Gen:  Appears stated age and in NAD. HEENT:  Normocephalic, atraumatic. The mucous membranes are moist. The superficial temporal arteries are without ropiness or tenderness. Cardiovascular: Regular rate and rhythm. Lungs: Clear to auscultation bilaterally. Neck: There are no carotid bruits noted bilaterally. Skin:  There is a rash around the mouth (from a razor patient states that was old)  NEUROLOGICAL:  Orientation:  The patient is alert and oriented x 3.   He relies on son for history.  Cranial nerves: There is good facial symmetry.  Speech is fluent but dysarthric. Soft palate rises symmetrically and there is no tongue deviation. Hearing is intact to conversational tone. Tone: Tone is good throughout. Sensation: Sensation is intact to  light touch throughout Coordination:  The patient has no dysdiadichokinesia or dysmetria.  He is somewhat apraxic with motor commands and coordination commands. Motor: Strength is 5/5 in the bilateral UE and LE today   MOVEMENT EXAM: Tremor:  No tremor in either hand with device on.    DBS programming performed today and described in more detail on a separate programming procedure notes.  In brief, the right DBS was OFF and was left off at patient and family request.  The L DBS was interrogated and that battery was good (2.95).     Assessment/Plan:   1.  Essential Tremor.  -The patient is status post DBS surgery to the left VIM on 07/31/2015.  He had his generator placed on 08/07/2015.  -The patient underwent right VIM DBS on 06/24/2016.  He had his IPG placed on 07/01/2016.This lead may be slightly anterior.  He has had less efficacy and more side effect issues with this side.  He has had significant number of falls.    The right side is OFF due to falls  -safety extensively discussed.  Talked about therapy as well as finding accountability in [employer], trainer, etc.   2.  Fall resulting in subdural hematoma in July, 2018  -pt finishing home therapy now.   3.  Diabetic peripheral neuropathy  -contributes to loss of balance.   4.  Memory change  -likely with some dementia now  -family providing care  -safety discussed 5.  F/u 6 months.  Much greater than 50% of this visit was spent in counseling and coordinating care.  Total face to face time:  25 min which didn't include DBS interrogation time

## 2017-07-07 ENCOUNTER — Encounter: Payer: Self-pay | Admitting: Neurology

## 2017-07-07 ENCOUNTER — Ambulatory Visit: Payer: Medicare Other | Admitting: Neurology

## 2017-07-07 ENCOUNTER — Encounter: Payer: Self-pay | Admitting: Psychology

## 2017-07-07 VITALS — BP 144/72 | HR 72

## 2017-07-07 DIAGNOSIS — G250 Essential tremor: Secondary | ICD-10-CM

## 2017-07-07 DIAGNOSIS — F0390 Unspecified dementia without behavioral disturbance: Secondary | ICD-10-CM

## 2017-07-07 DIAGNOSIS — Z9689 Presence of other specified functional implants: Secondary | ICD-10-CM

## 2017-07-07 NOTE — Progress Notes (Signed)
Subjective:   Logan Griffith was seen in consultation in the movement disorder clinic at the request of Center, [facility].  The evaluation is for tremor.  The patient is seen in consultation at the request of Dr. Jannifer Franklin.  I have reviewed her notes and appreciate those.  This patient is accompanied in the office by his spouse and child who supplement the history.  The patient has had essential tremor for approximately 5 years according to records.   It has been worse over the last 2 years.  It is in both hands.  He notices it most in the R hand but he is R hand dominant.  He has trouble writing checks and eating sounds.  The patient is on primidone, 250 mg daily.  He has never been on a higher dosage than this but he indicates that it is not helpful.  The patient states he is on labetalol, 300 mg twice a day.  He was told that he could take a higher dosage of this to see if that helped his tremor, but he has not done that.  Pt states that the labetolol is for BP, however.   He has noticed no benefit with the primidone.  Unfortunately, these medications are not helping.  He does have a history of kidney stones, and therefore is not able to have Topamax.  There is no family hx of tremor.  He comes in today looking for an opinion regarding DBS therapy.   Affected by caffeine:  No. (1-2 cups coffee/day - 18 oz each; 1-2 diet soda's a day) Affected by alcohol:  No. Affected by stress:  No. Affected by fatigue:  No. Spills soup if on spoon:  Yes.   Spills glass of liquid if full:  Yes.   Affects ADL's (tying shoes, brushing teeth, etc):  Yes.   (has to shave with 2 hands with blade)  Current/Previously tried tremor medications: primidone; beta blocker for BP  Outside reports reviewed: historical medical records and office notes.  04/07/15 update:  The patient returns today, accompanied by his wife and son who supplement the history.  The patient had neuropsych testing done by Dr. Richrd Sox on  03/18/2015.  She felt that he would be a good DBS candidate.  No evidence of dementia.  The patient remains on primidone, 250 mg twice a day for his tremor.  He is also on labetalol 300 mg twice a day but that is primarily for his blood pressure.  He has a history of nephrolithiasis and cannot take Topamax.  He remains interested in DBS therapy.  06/18/15 update: The patient follows up with me today, and final preparation for his DBS surgery.  It is scheduled for 07/30/2014.  He remains on primidone, 250 mg but is down to once a day.  He has seen Dr. Vertell Limber and reviewed logistics of surgery with him.  He expresses desire to proceed with DBS therapy.  09/04/15 update:  The patient is following up today, accompanied by his wife, son, daughter in law who supplement the history.  The patient is status post DBS surgery to the left VIM on 07/31/2015.  He had his generator placed on 08/07/2015.  He did have difficulty voiding after the surgery and did have to leave the hospital with a Foley in place.  He has had some nausea following surgery.    09/11/15 update:  The patient is following up today, accompanied by his wife who supplements the history.  The patient reports that  he has been doing well this week.  He is happy to report that he has written checks from the checkbook for the first time in a long time.  He also states that he has been shave with one hand.  He asks me about potentially having DBS for the left hand.  He continues to have some nausea.  I have reviewed records from his primary care provider from [place].  They started him on ranitidine.  He is not sure if it is helping.  He has episodes of emesis up to 3 times per week.  This seemed to start after surgery.  11/19/15 update:  The patient is following up today for his ET.  The patient is status post DBS surgery to the left VIM on 07/31/2015.  He had his generator placed on 08/07/2015.  He had places froze on his arms and legs.  Nausea is much better  than previous but "it has been a long while."  He thinks that is related to eating too much.  Overall, very happy with DBS.  No significant tremor on the right hand.  He saw Dr. Vertell Limber yesterday and pt mentioned that he is thinking about doing DBS on the other side.    03/23/16 update:  Pt f/u for ET.  He is doing well with regards to tremor in the right hand.  He wants to do DBS on the other side.  Asks me if we can get it done after the first of the year.  He eats in the living room and holds the glass in one hand and has trouble holding food with the other hand.   In regards to nausea he states that he has been doing well.  He had a few falls trying to tie his shoes and fell out of the chair (just slipped off the cushion).  He is in PT now.  07/21/16 update:  Patient follows up today, accompanied by his wife and son who supplement the history.  The patient underwent right VIM DBS on 06/24/2016.  He had his IPG placed on 07/01/2016.  Postoperative MRI showed expected lead placement.  Patient reports that he has recovered well.  He did fall 3 times in one day.  He fell over a chair.  He then was at a restaurant and spilled his drink and slipped on the drink.  He also fell outside that day.  Been to the doctor several times as he hurt his shoulder.    08/05/16 update:  Patient follows up today, accompanied by his son who supplements the history.  Patient's device was activated somewhat 2 weeks ago.  He continues to have some tremor in the left hand.  He has had no falls since our last visit.  He has not had any nausea.  He does tell me after our last visit he had a passing out episode.  Was getting out of the car and passed out for few seconds.  No warning.  No loss bladder/bowel control.  No confusion after.  No palpitations.  Didn't check BP or BS after the event.  He had not eaten at all and it was past lunch time (he is diabetic).    09/12/16 update: Patient follows up today, accompanied by his wife who  supplements the history.  Patient's wife had told us last week (09/05/2016) that he was having a significant number of falls since changes were made to his DBS.  He worked hard on trying to reprogram the device  at that he had efficacy without falls.  His wife feels that he has had personality change and speech change.  Because of that, a CT of the brain was done and it was unremarkable.  He has not had any falls since the reprogramming a week ago but has had near falls.    10/17/16 update:  Patient seen today, accompanied by his wife who supplements the history.  Last visit, I significantly dropped down the frequency and increased the amplitude of the DBS settings on the right brain, hoping that it would help with falls.  We also sent him to physical therapy.  It doesn't appear that he attended that and he last went to that in October.  However, he has not had further falls.  Wife does think that getting him suspenders for pants helped because has 2 hands to walk for balance now and not holding pants up with one hand.   No further syncopal episodes.  Tremor has been good.  Speech continues to be an issue for wife and others trying to understand him on the phone.  12/14/16 update:  Seen as a walk in.  Accompanied by a son and wife who supplement the history.  Slowly getting worse but much worse this week both in speech and walking.  Went from 1 fall a month to 1 fall a week.  Could walk in my office but then can't get out of the office (after device turned off on the right by medical assistant).    02/23/17 update:  Patient was seen in follow-up today for his essential tremor.  Much has happened since our last visit.  I have reviewed extensive numbers of records.  When I saw him last visit, he was having acute difficulties in walking.  I ended up sending him to the emergency room at [facility].  CT brain was done and reviewed by me.  It was nonacute.  [facility] felt that the issue was nonacute and he was sent home.  He ended  up presenting to [facility] on July 20 and was admitted for L frontal SDH, max of 35mm thick.  He was encephalopathic on admission and required precedex gtt due to BP issues.  He had LTM EEG due to decreased LOC and that was negative.  He went to SNF for rehab for about a month and he got stronger.  He has been home for about a month and [facility] is coming to the home.   The walker is with him all the time.  One fall since home and fell into the desk.    07/07/17 update: Patient is seen today in follow-up for essential tremor.  Once again, a lot has happened since our last visit.  I have reviewed an extensive number of records.  The patient is also accompanied by his son who supplements the history today.  Patient went to the emergency room in December with cough and oxygen desaturations.  Was found to have pneumonia.  Was admitted.  Patient went back to the hospital June 20, 2017 with mental status change.  EEG was completed while in the hospital which demonstrated mild slowing and intermittent rhythmic delta activity.  No evidence of seizure.  CT of the brain was nonacute.  Records indicate patient requested transfer to [facility] for movement care and now has an appointment on August 22, 2017 with Dr. Hall Busing.  Pt states it is closer to home and would like 2nd opinon.   Pt currently in SNF for rehab.  Pt asks if he will get rid of the walker.  Son states pt does better in SNF than at home.    Allergies  Allergen Reactions  . No Known Allergies     Outpatient Encounter Medications as of 07/07/2017  Medication Sig  . albuterol (PROVENTIL HFA;VENTOLIN HFA) 108 (90 Base) MCG/ACT inhaler Inhale into the lungs every 6 (six) hours as needed for wheezing or shortness of breath.  Marland Kitchen amLODipine (NORVASC) 10 MG tablet Take 10 mg by mouth daily.  Marland Kitchen aspirin EC 81 MG tablet Take 81 mg by mouth daily.  Marland Kitchen atorvastatin (LIPITOR) 40 MG tablet Take 40 mg by mouth at bedtime.   . beta carotene w/minerals (OCUVITE) tablet Take 1  tablet by mouth daily.  . clotrimazole (LOTRIMIN) 1 % cream Apply 1 application topically 2 (two) times daily.  . finasteride (PROSCAR) 5 MG tablet Take 5 mg by mouth at bedtime.   . gabapentin (NEURONTIN) 600 MG tablet Take 600 mg by mouth 2 (two) times daily.  Marland Kitchen guaifenesin (HUMIBID E) 400 MG TABS tablet Take 400 mg by mouth 3 (three) times daily.  . insulin lispro (HUMALOG) 100 UNIT/ML cartridge Inject into the skin See admin instructions. Sliding scale  . labetalol (NORMODYNE) 100 MG tablet Take 100 mg by mouth 2 (two) times daily.  Marland Kitchen linagliptin (TRADJENTA) 5 MG TABS tablet Take 5 mg by mouth daily.  Marland Kitchen losartan (COZAAR) 50 MG tablet Take 50 mg by mouth daily.  . pantoprazole (PROTONIX) 40 MG tablet Take 40 mg by mouth daily.  . pimecrolimus (ELIDEL) 1 % cream Apply 1 application topically daily as needed (for psoriasis).  . Polyvinyl Alcohol-Povidone (REFRESH OP) Apply 1 drop to eye daily as needed (dry eyes).  . sertraline (ZOLOFT) 100 MG tablet Take 100 mg by mouth daily.  . tamsulosin (FLOMAX) 0.4 MG CAPS capsule Take 0.4 mg by mouth at bedtime.   . [DISCONTINUED] empagliflozin (JARDIANCE) 25 MG TABS tablet Take 25 mg by mouth daily.  . [DISCONTINUED] gemfibrozil (LOPID) 600 MG tablet Take 600 mg by mouth 2 (two) times daily before a meal.  . [DISCONTINUED] glimepiride (AMARYL) 4 MG tablet Take 4 mg by mouth daily with breakfast.  . [DISCONTINUED] losartan-hydrochlorothiazide (HYZAAR) 100-25 MG per tablet Take 1 tablet by mouth daily.  . [DISCONTINUED] mirabegron ER (MYRBETRIQ) 50 MG TB24 tablet Take 50 mg by mouth daily.  . [DISCONTINUED] TOUJEO SOLOSTAR 300 UNIT/ML SOPN Inject 56 Units into the skin at bedtime.    No facility-administered encounter medications on file as of 07/07/2017.     Past Medical History:  Diagnosis Date  . Arthritis   . BPH (benign prostatic hyperplasia)    takes Finasteride and Flomax daily  . Depression    takes Zoloft daily  . Diabetes ([place])    takes  Toujeo,Jardiance,Amaryl,and Tradjenta daily  . Eczema    uses a cream  . History of colon polyps    benign  . History of kidney stones   . Hyperlipemia    takes Atorvastatin daily as well as Lopid  . Hypertension    takes Amlodipine,Labetalol daily as well as Losartan-HCTZ  . Low back pain    sciatica  . Nocturia   . Peripheral neuropathy   . Peripheral neuropathy    takes Gabapentin daily  . Psoriasis   . Sleep apnea   . Tremor   . Urinary urgency     Past Surgical History:  Procedure Laterality Date  . ANKLE ARTHROSCOPY Left  x 2  . ANKLE FUSION Left   . CARPAL TUNNEL RELEASE N/A 07/24/2015   Procedure: Fiducial placement for deep brain stimulator;  Surgeon: Erline Levine, MD;  Location: [place] [facility]UR[facility];  Service: Neurosurgery;  Laterality: N/A;  Fiducial placement for deep brain stimulator  . CIRCUMCISION    . COLONOSCOPY    . EYE SURGERY Bilateral    blepharoplasty  . PULSE GENERATOR IMPLANT Left 08/07/2015   Procedure: UNILATERAL PULSE GENERATOR IMPLANT (LEFT);  Surgeon: Erline Levine, MD;  Location: [place] [facility];  Service: Neurosurgery;  Laterality: Left;  UNILATERAL PULSE GENERATOR IMPLANT (LEFT)  . PULSE GENERATOR IMPLANT Right 07/01/2016   Procedure: RIGHT IMPLANTABLE PULSE GENERATOR PLACEMENT;  Surgeon: Erline Levine, MD;  Location: [place];  Service: Neurosurgery;  Laterality: Right;  RIGHT IMPLANTABLE PULSE GENERATOR PLACEMENT  . SUBTHALAMIC STIMULATOR INSERTION Left 07/31/2015   Procedure: LEFT DEEP BRAIN STIMULATOR INSERTION;  Surgeon: Erline Levine, MD;  Location: [place] NEUR[facility] OR[facility];  Service: Neurosurgery;  Laterality: Left;  LEFT DEEP BRAIN STIMULATOR INSERTION  . SUBTHALAMIC STIMULATOR INSERTION Right 06/24/2016   Procedure: RIGHT DEEP BRAIN STIMULATOR PLACEMENT;  Surgeon: Erline Levine, MD;  Location: [place];  Service: Neurosurgery;  Laterality: Right;  RIGHT DEEP BRAIN STIMULATOR PLACEMENT  . Thumb surgery Left    ligament    Social History   Socioeconomic History  .  Marital status: Married    Spouse name: Not on file  . Number of children: Not on file  . Years of education: Not on file  . Highest education level: Not on file  Social Needs  . Financial resource strain: Not on file  . Food insecurity - worry: Not on file  . Food insecurity - inability: Not on file  . Transportation needs - medical: Not on file  . Transportation needs - non-medical: Not on file  Occupational History  . Not on file  Tobacco Use  . Smoking status: Former Smoker    Years: 24.00  . Smokeless tobacco: Never Used  . Tobacco comment: quit smoking in 1984  Substance and Sexual Activity  . Alcohol use: No    Alcohol/week: 0.0 oz  . Drug use: No  . Sexual activity: Not on file  Other Topics Concern  . Not on file  Social History Narrative  . Not on file    Family Status  Relation Name Status  . Mother  Deceased       HTN  . Father  Deceased       HTN  . Brother  Alive    Review of Systems  A complete 10 system ROS was obtained and was negative apart from what is mentioned.   Objective:   VITALS:   Vitals:   07/07/17 1443  BP: (!) 144/72  Pulse: 72  SpO2: 96%   Gen:  Appears stated age and in NAD. HEENT:  Normocephalic, atraumatic. The mucous membranes are moist. The superficial temporal arteries are without ropiness or tenderness. Cardiovascular: Regular rate and rhythm. Lungs: Clear to auscultation bilaterally. Neck: There are no carotid bruits noted bilaterally.   NEUROLOGICAL:  Orientation:  The patient is alert and oriented x 3.   He relies on son for history.  Cranial nerves: There is good facial symmetry.  Has some dysphasia today, but is intermittent.  No dysarthria.   Soft palate rises symmetrically and there is no tongue deviation. Hearing is intact to conversational tone. Tone: Tone is good throughout. Sensation: Sensation is intact to light touch throughout Coordination:  The patient has no dysdiadichokinesia or dysmetria.  He is  somewhat apraxic with motor commands and coordination commands. Motor: Strength is 5/5 in the bilateral UE and LE today   MOVEMENT EXAM: Tremor:  No tremor in either hand with device on.    DBS programming performed today and described in more detail on a separate programming procedure notes.  In brief, the right DBS was OFF and was left off at patient and family request.  The L DBS was interrogated and that battery was good (2.93).     Assessment/Plan:   1.  Essential Tremor.  -The patient is status post DBS surgery to the left VIM on 07/31/2015.  He had his generator placed on 08/07/2015.    -The patient underwent right VIM DBS on 06/24/2016.  He had his IPG placed on 07/01/2016.This lead may be slightly anterior.  He has had less efficacy and more side effect issues with this side.  He has had significant number of falls.    The right side is OFF due to falls.    -Pt has appt at [facility] with Dr. Hall Busing for transfer of care on 08/22/17 due to fact that this is closer to their home.   2.  Fall resulting in subdural hematoma in July, 2018  -pt has gone downhill since that time.  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 3.  Diabetic peripheral neuropathy  -contributes to loss of balance.   4.  dementia  -doing better in rehab.  Talked about regular schedule once gets home.  Talked about respite care for wife.  Having Education officer, museum investigate PACE resources in [place] for them.  Talked about daily exercise, physically and mentally at home.  Needs 24 hour per day care. 5.  F/u prn.  Much greater than 50% of this visit was spent in counseling and coordinating care.  Total face to face time:  50 min, not including DBS interrogation time.

## 2017-07-07 NOTE — Procedures (Signed)
DBS Programming was performed.    Total time spent programming was 10 min.  Device was confirmed to be on.  Soft start was on at 8.  Impedences were checked and were within normal limits.  Battery was checked and was determined to be functioning normally and not near the end of life (2.93).  Final settings were as follows:  Left brain electrode:     1-2+           ; Amplitude  2.9   V   ; Pulse width 90 microseconds;   Frequency   160   Hz. (note that contact 3 does have a high impedence of 3168)  Right brain electrode Group A:   0-C+     ; Amplitude: 4.3 V; pulse width 150 microsec; frequency 120 HZ  Right brain electrode Group B:   2-C+         ; Amplitude  4.0   V   ; Pulse width 100 microseconds;   Frequency   150   Hz.     Pt left on group A. But right brain electrode left OFF before patient left

## 2017-07-07 NOTE — Progress Notes (Addendum)
I met with the patient and his son today while they were in the clinic.  I showed them did a demonstration of the reminder/pillbox that we have at our office.  We also talked about other strategies such as setting an alarm for each time he had to take medications on a smart phone if they use that technology at home.  We talked a little bit about the need for possible respite care services once the patient is discharged back home from the rehab care facility.  I will look into respite care agencies and see if there are any community respite programs in the area that he lives.  I recommended that he talk with the social worker at the rehab facility since it is close to his home and she will probably be familiar with resources in the area.  I also recommended the pace program as an option and will get information to the son on that program.  The patient's son stated that the pace program has been brought up to the family before in the past.  I will be transferring care to [facility]  I have offered to provide any support to them during this transition.  Even after the transition if they have decided that they would like to get the reminder pillbox I am more than happy to help them set one up.  They have my contact information and I will be emailing the appropriate resources to the patient's son.   Resources that were provided:   [place]:  [email or web address]/  Respite Care Program's:  [email or web address]  In-Home Service Program  The [place] Program is a vital community program provided by [facility] Department of [employer]; that is designed to help older adults meet their goal of remaining in the home setting as long as their health and well-being permit.  In-Home Aide Services can provide assistance to individuals needing assistance with daily activities like bathing, dressing, toileting,  mobility, meal preparation, essential transportation, and other routine tasks. The program also offers limited respite care to provide needed relief to primary caregivers of someone requiring 24-hour care.  Eligibility To be eligible for the [facility], individuals must meet all of the following criteria: . Resident of [facility] . At least 78 years of age . Live at home . Unable to perform one or more activities of daily living due to a functional, physical, or mental impairment and require help in order to remain in their own home. . A responsible person is not available to perform the needed assistance. Due to limited funding and waiting lists, [place] gives the consumer options for receiving this service. Private Pay  Consumers may elect to pay for their [place] using the private pay option. This option allows the consumer to by-pass the wait list and begin receiving services usually within 7 to 10 business days. Grant Assistance : Consumers who elect not to participate in the private pay option may request assistance from the Home and [place] option. However, to be eligible, you must need help with personal care tasks such as bathing. This option is available to qualified consumers at a cost that is consistent with what they can afford. Statements are mailed out monthly and consumers are encouraged to contribute toward the cost of the service that they have received. A self-addressed envelope will be enclosed with the statement for the consumer to mail in a contribution if able.  No one is ever turned away due to the inability to pay; however, contributions are needed for program maintenance due to limited funding. How to Apply/Make a Referral :  To apply for or make a referral for the [facility], please contact the Information and Options Counseling specialist at [phone].  Building control surveyor and medical  transportation is offered to seniors 82 years of age and older and their spouse or caregiver.                                                                                                                                                                                                                                                  Persons approved by [employer] may be transported at no charge to: Marland Kitchen [employer] sites ([place], [place], [place]) . Medical and Dental Appointments (Non-Medicaid recipients only) - (Medical trips include, physicians, clinics, dialysis, vision, mental health and chiropractic) . Some [place] ([place] and [place]) . [place] (Senior Day at [employer], senior games and [employer] and [employer] parties) . Film/video editor (Groceries and pharmacy)

## 2017-07-10 ENCOUNTER — Ambulatory Visit: Payer: Medicare Other | Admitting: Neurology

## 2017-10-12 ENCOUNTER — Telehealth: Payer: Self-pay | Admitting: Neurology

## 2017-10-12 NOTE — Telephone Encounter (Signed)
I know that pt now being seen at [facility].  Just wanting to do "wellness check" and make sure pt doing okay and has f/u at [facility].

## 2017-10-12 NOTE — Telephone Encounter (Signed)
Mychart message sent to patient.

## 2018-06-06 DEATH — deceased

## 2018-08-11 IMAGING — MR MR HEAD W/O CM
2 of 3 series · 33 of 48 positions shown · non-contrast
Comparison: CT of the head head go 06/16/2016 and MRI brain
05/25/2016.

CLINICAL DATA: Deep brain stimulator.  Lead placement.

EXAM:
MRI HEAD WITHOUT CONTRAST
TECHNIQUE: Multiplanar, multiecho pulse sequences of the brain and surrounding
structures were obtained without intravenous contrast.

[Series 8: ax fspgr · axial · 2.0mm · 0.94mm/px · z∈[+24,+57]mm · 8 of 18 slices shown (1 of 2)]
[im 1/18]
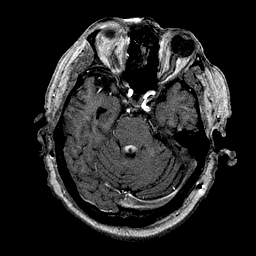
[im 3/18]
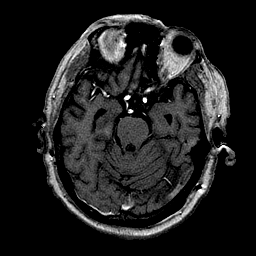
[im 5/18]
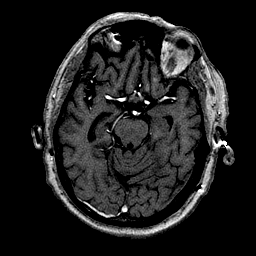
[im 8/18]
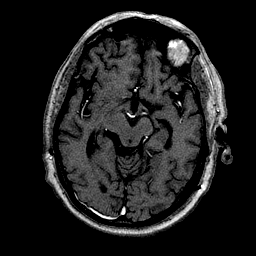
[im 10/18]
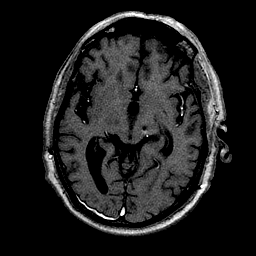
[im 13/18]
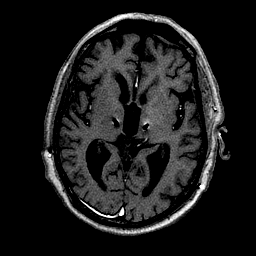
[im 15/18]
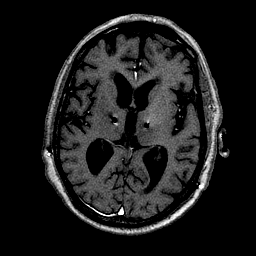
[im 18/18]
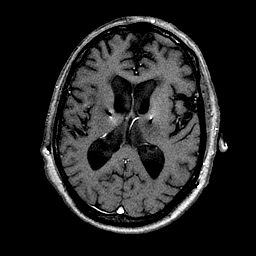

[Series 9: ax fspgr · axial · 2.0mm · 0.94mm/px · z∈[-1,+110]mm · 25 of 58 slices shown (2 of 2)]
[im 1/58]
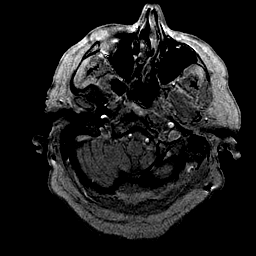
[im 3/58]
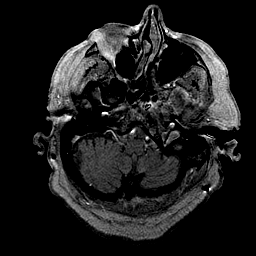
[im 5/58]
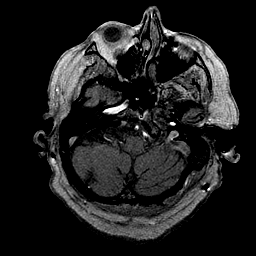
[im 8/58]
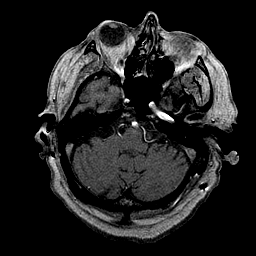
[im 10/58]
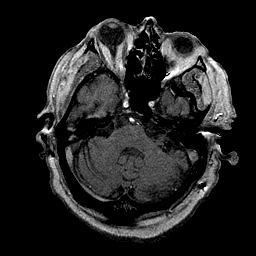
[im 12/58]
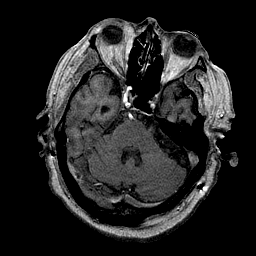
[im 15/58]
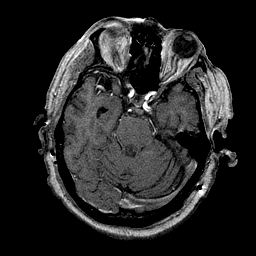
[im 17/58]
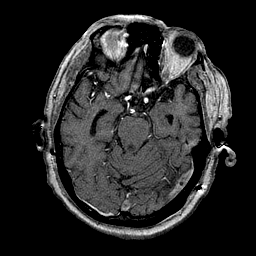
[im 20/58]
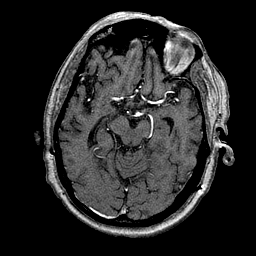
[im 22/58]
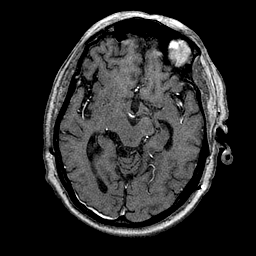
[im 24/58]
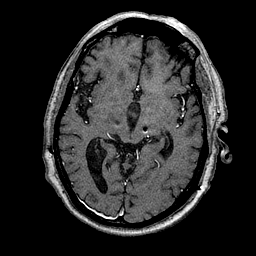
[im 27/58]
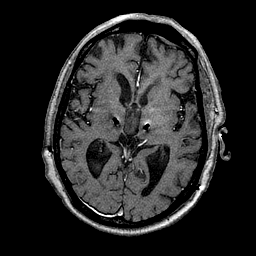
[im 29/58]
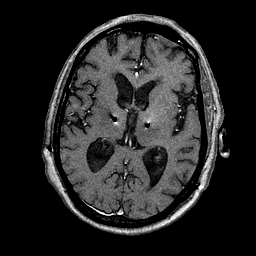
[im 31/58]
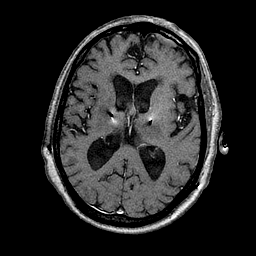
[im 34/58]
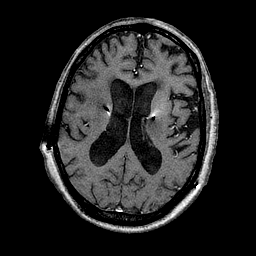
[im 36/58]
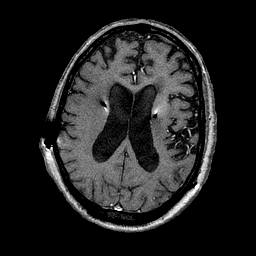
[im 39/58]
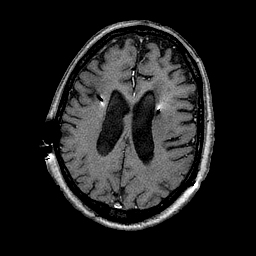
[im 41/58]
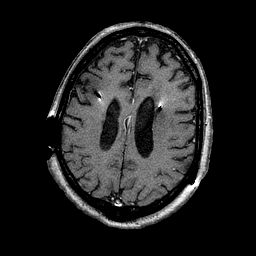
[im 43/58]
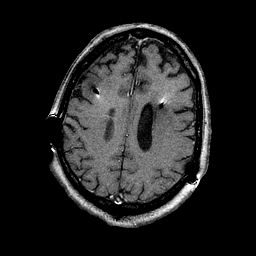
[im 46/58]
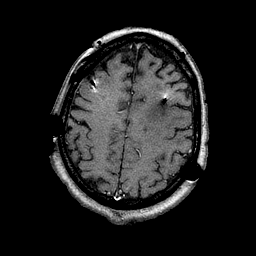
[im 48/58]
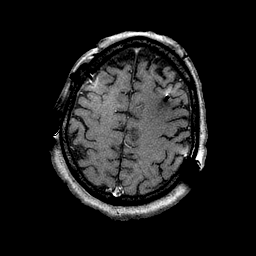
[im 50/58]
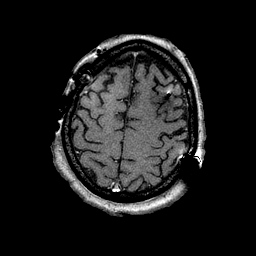
[im 53/58]
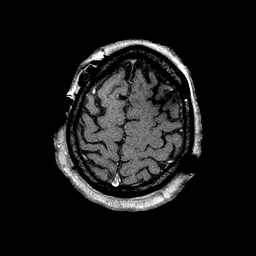
[im 55/58]
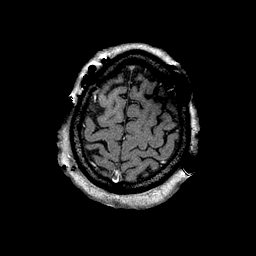
[im 58/58]
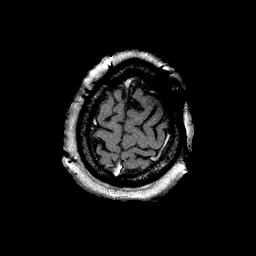

[33 of 48 positions shown; findings below may reference images not displayed]

FINDINGS: Brain: The a right-sided deep brain stimulator has been placed. The
tip is slightly higher than on the left. The left deep brain
stimulator terminates at the [REDACTED].

Burr holes are present bilaterally. There is no significant
hemorrhage along the tract. No complicating features are present.
IMPRESSION: 1. Interval placement of right-sided deep brain stimulator without
MRI evidence for complication.
2. Positioning of the right brain stimulator is slightly higher than
the left brain stimulator.

## 2019-01-10 IMAGING — CT CT HEAD W/O CM
4 series · 14 of 47 positions shown, 16 images · non-contrast
Comparison: 09/08/2016

CLINICAL DATA: Worsening falls and slurred speech.  Lethargic.

EXAM:
CT HEAD WITHOUT CONTRAST
TECHNIQUE: Contiguous axial images were obtained from the base of the skull
through the vertex without intravenous contrast.

[Series 3: head without · axial · non-contrast · 0.50mm/px · z∈[-68,+52]mm · 7 of 33 slices shown, 9 images]
[im 5/33  brain]
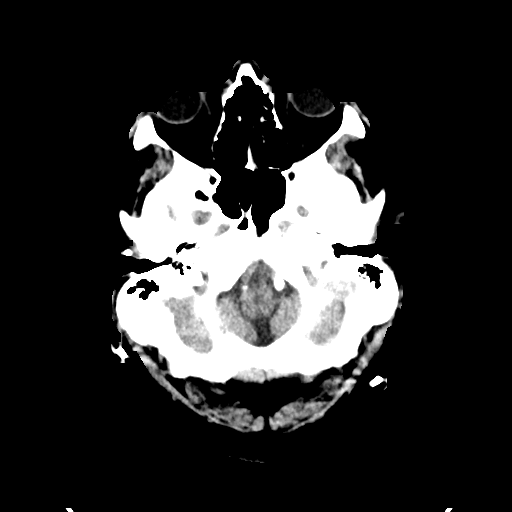
[im 5/33  bone]
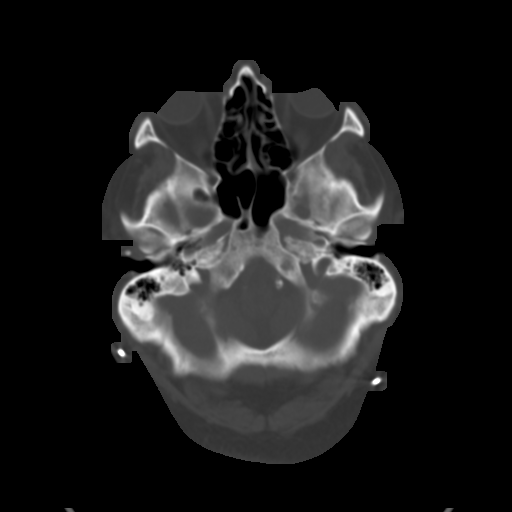
[im 9/33  brain]
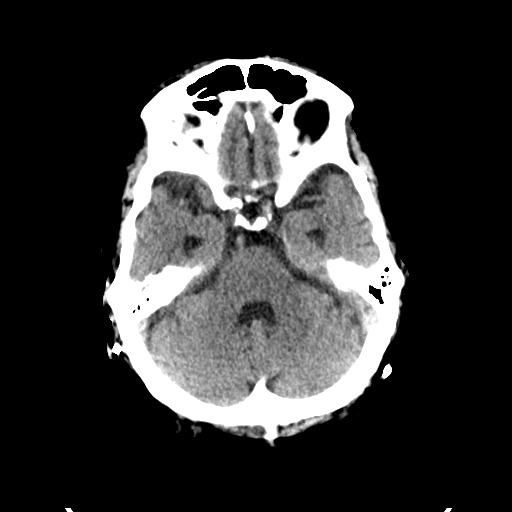
[im 13/33  brain]
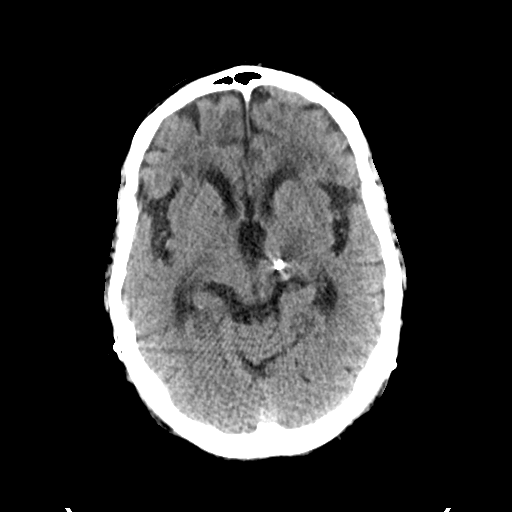
[im 17/33  brain]
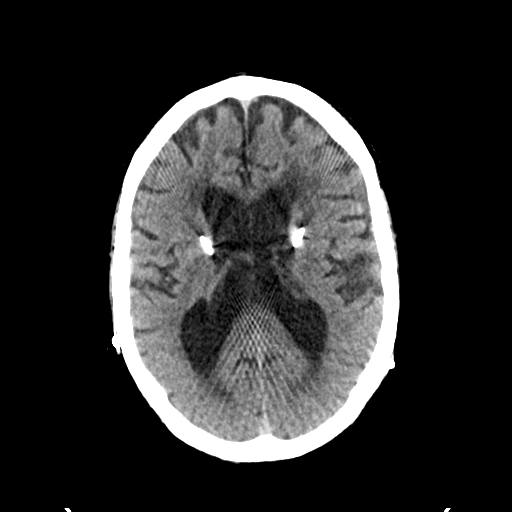
[im 21/33  brain]
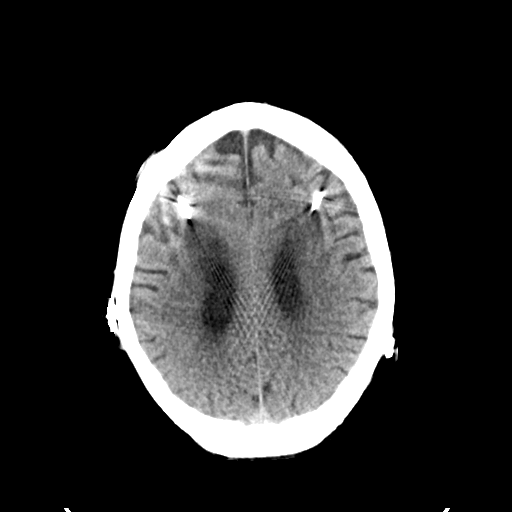
[im 21/33  bone]
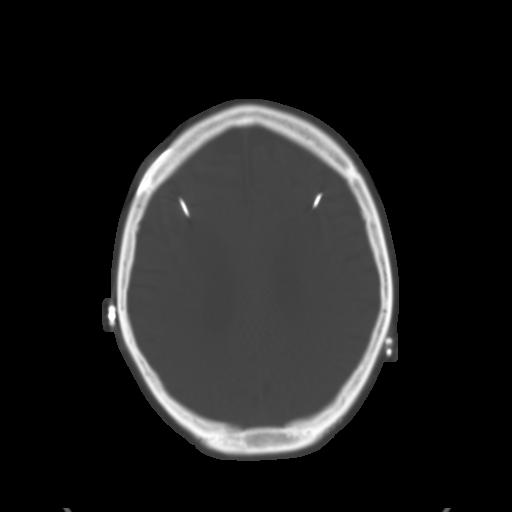
[im 25/33  brain]
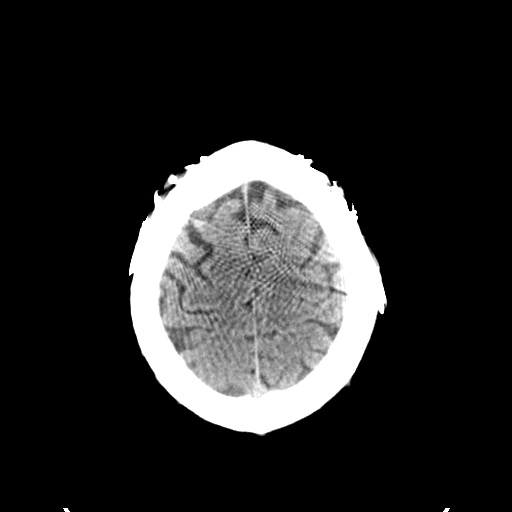
[im 29/33  brain]
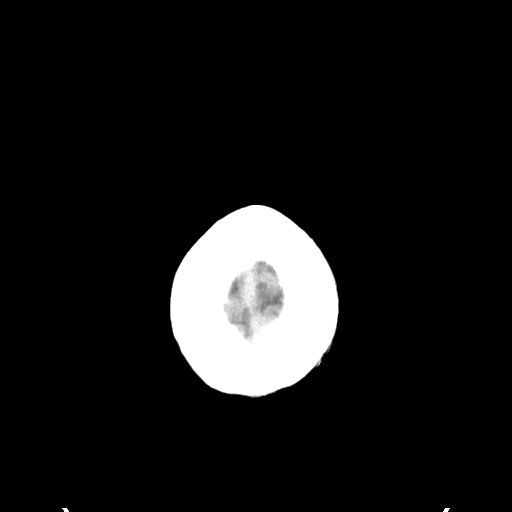

[Series 4: head bone · axial · 0.50mm/px · 1 of 82 slices shown]
[im 9/82  bone]
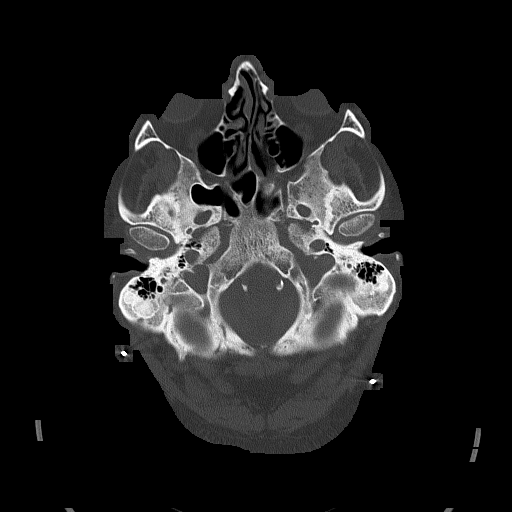

[Series 5: head without cor · coronal · non-contrast · 0.32mm/px · 3 of 68 slices shown]
[im 23/68  brain]
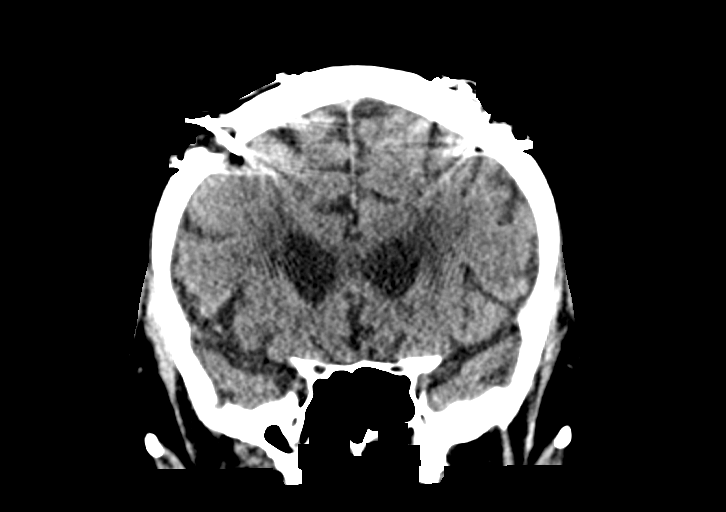
[im 30/68  brain]
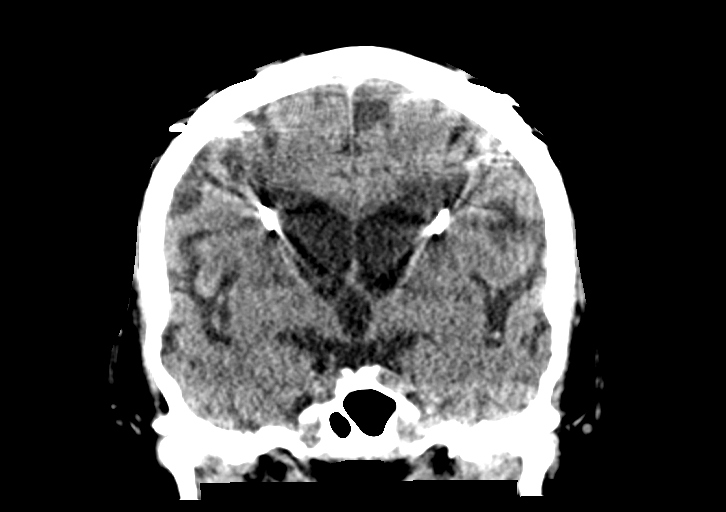
[im 38/68  brain]
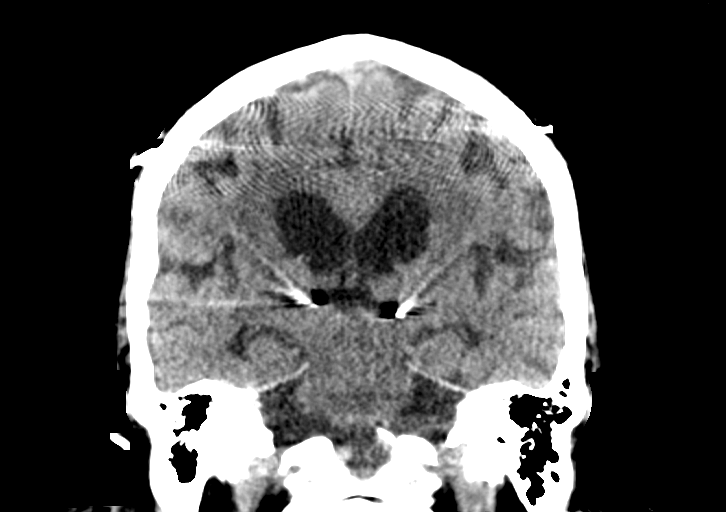

[Series 6: head without sag · sagittal · non-contrast · 0.32mm/px · 3 of 67 slices shown]
[im 23/67  brain]
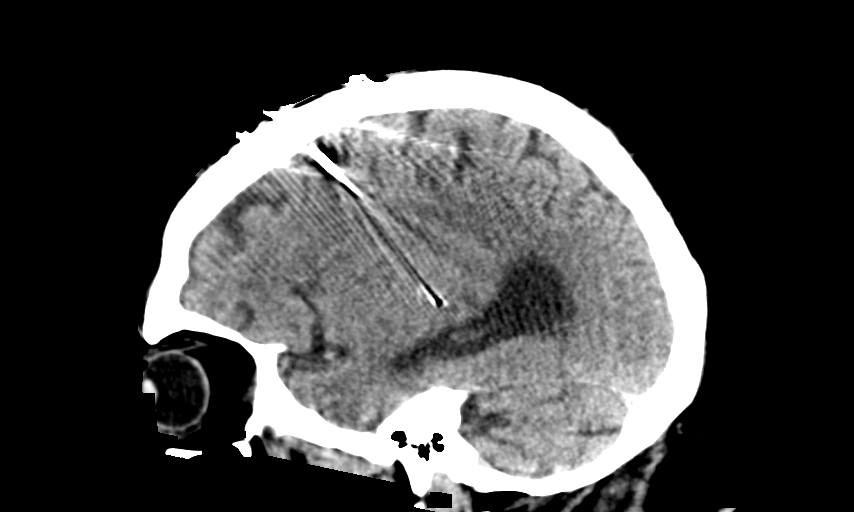
[im 34/67  brain]
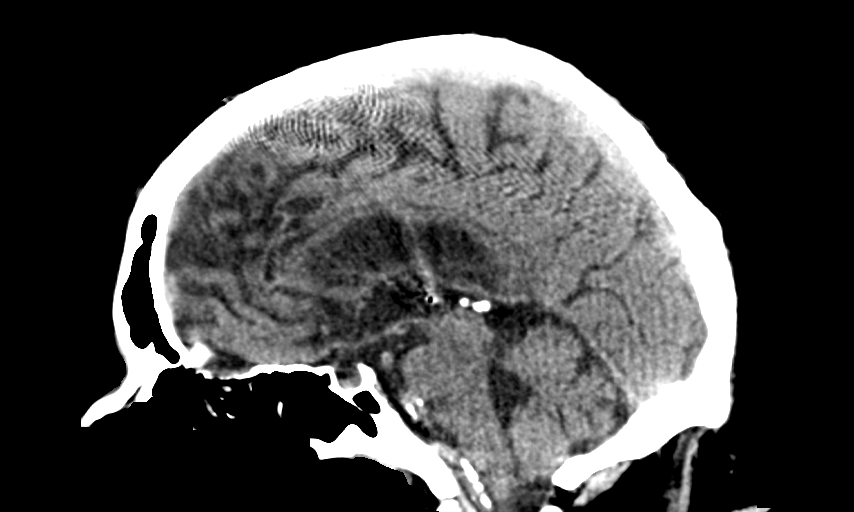
[im 45/67  brain]
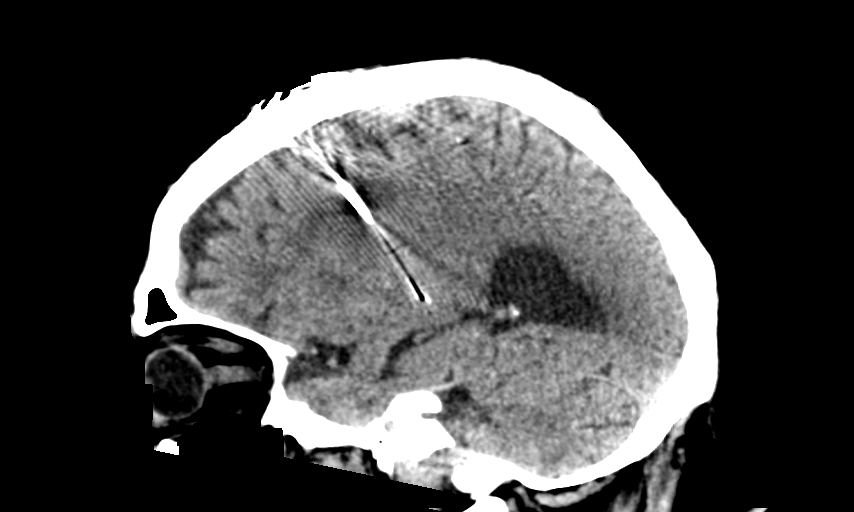

[14 of 47 positions shown; findings below may reference images not displayed]

FINDINGS: Brain: No evidence of acute infarction, hemorrhage, hydrocephalus,
extra-axial collection or mass lesion/mass effect. Unchanged
positioning of the deep brain stimulator leads, terminating in the
region of bilateral subthalamic nuclei. Moderate brain parenchymal
volume loss and periventricular microangiopathy.

Vascular: Vascular calcifications at the skullbase.

Skull: Normal. Negative for fracture or focal lesion.

Sinuses/Orbits: No acute finding.

Other: None.
IMPRESSION: No acute intracranial abnormality.

Moderate brain parenchymal atrophy and chronic microvascular
disease.

Stable positioning of deep brain stimulator leads.
